# Patient Record
Sex: Male | Born: 1945 | Race: White | Hispanic: No | Marital: Married | Smoking: Current some day smoker
Health system: Southern US, Community
[De-identification: ages and names within clinical notes are randomized; demographics above are authoritative.]

## PROBLEM LIST (undated history)

## (undated) DIAGNOSIS — Z973 Presence of spectacles and contact lenses: Secondary | ICD-10-CM

## (undated) DIAGNOSIS — K08109 Complete loss of teeth, unspecified cause, unspecified class: Secondary | ICD-10-CM

## (undated) DIAGNOSIS — N529 Male erectile dysfunction, unspecified: Secondary | ICD-10-CM

## (undated) DIAGNOSIS — R3912 Poor urinary stream: Secondary | ICD-10-CM

## (undated) DIAGNOSIS — Z7901 Long term (current) use of anticoagulants: Secondary | ICD-10-CM

## (undated) DIAGNOSIS — I499 Cardiac arrhythmia, unspecified: Secondary | ICD-10-CM

## (undated) DIAGNOSIS — I10 Essential (primary) hypertension: Secondary | ICD-10-CM

## (undated) DIAGNOSIS — I482 Chronic atrial fibrillation, unspecified: Principal | ICD-10-CM

## (undated) DIAGNOSIS — Z972 Presence of dental prosthetic device (complete) (partial): Secondary | ICD-10-CM

## (undated) DIAGNOSIS — K429 Umbilical hernia without obstruction or gangrene: Secondary | ICD-10-CM

## (undated) DIAGNOSIS — N486 Induration penis plastica: Secondary | ICD-10-CM

## (undated) DIAGNOSIS — N138 Other obstructive and reflux uropathy: Secondary | ICD-10-CM

## (undated) DIAGNOSIS — N401 Enlarged prostate with lower urinary tract symptoms: Secondary | ICD-10-CM

## (undated) DIAGNOSIS — R310 Gross hematuria: Secondary | ICD-10-CM

## (undated) HISTORY — DX: Essential (primary) hypertension: I10

## (undated) HISTORY — PX: TONSILLECTOMY: SUR1361

## (undated) HISTORY — PX: TRANSTHORACIC ECHOCARDIOGRAM: SHX275

## (undated) HISTORY — DX: Umbilical hernia without obstruction or gangrene: K42.9

## (undated) HISTORY — DX: Male erectile dysfunction, unspecified: N52.9

## (undated) HISTORY — DX: Chronic atrial fibrillation, unspecified: I48.20

## (undated) HISTORY — PX: KNEE ARTHROSCOPY: SUR90

---
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
## 2013-11-19 DIAGNOSIS — I499 Cardiac arrhythmia, unspecified: Secondary | ICD-10-CM

## 2013-11-19 HISTORY — DX: Cardiac arrhythmia, unspecified: I49.9

## 2015-08-20 DIAGNOSIS — I482 Chronic atrial fibrillation, unspecified: Secondary | ICD-10-CM

## 2015-08-20 HISTORY — DX: Chronic atrial fibrillation, unspecified: I48.20

## 2015-09-12 ENCOUNTER — Encounter: Payer: Self-pay | Admitting: Cardiology

## 2015-09-14 DIAGNOSIS — K429 Umbilical hernia without obstruction or gangrene: Secondary | ICD-10-CM | POA: Insufficient documentation

## 2015-09-14 DIAGNOSIS — R002 Palpitations: Secondary | ICD-10-CM | POA: Insufficient documentation

## 2015-09-14 DIAGNOSIS — N529 Male erectile dysfunction, unspecified: Secondary | ICD-10-CM | POA: Insufficient documentation

## 2015-09-15 ENCOUNTER — Ambulatory Visit (INDEPENDENT_AMBULATORY_CARE_PROVIDER_SITE_OTHER): Payer: PPO | Admitting: Cardiology

## 2015-09-15 ENCOUNTER — Encounter: Payer: Self-pay | Admitting: Cardiology

## 2015-09-15 VITALS — BP 138/86 | HR 92 | Ht 68.5 in | Wt 175.0 lb

## 2015-09-15 DIAGNOSIS — I4891 Unspecified atrial fibrillation: Secondary | ICD-10-CM | POA: Diagnosis not present

## 2015-09-15 LAB — BASIC METABOLIC PANEL
BUN: 17 mg/dL (ref 7–25)
CHLORIDE: 104 mmol/L (ref 98–110)
CO2: 25 mmol/L (ref 20–31)
Calcium: 9.9 mg/dL (ref 8.6–10.3)
Creat: 0.93 mg/dL (ref 0.70–1.25)
Glucose, Bld: 79 mg/dL (ref 65–99)
POTASSIUM: 4.4 mmol/L (ref 3.5–5.3)
Sodium: 141 mmol/L (ref 135–146)

## 2015-09-15 LAB — TSH: TSH: 1.542 u[IU]/mL (ref 0.350–4.500)

## 2015-09-15 NOTE — Progress Notes (Signed)
Cardiology Office Note   Date:  09/16/2015   ID:  Scott Newton, Scott Newton, MRN [id number]  PCP:  London Pepper, MD    Chief Complaint  Patient presents with  . Atrial Fibrillation      History of Present Illness: Scott Newton is a 69 y.o. male who presents for evaluation of new onset atrial fibrillation.  [name] and palpitations and was found to have new onset atrial fibrillation by his PCP on 08/31/2015.  He was found to be in atrial fibrillation and was started on Eliquis. He has a history of palpitations but no [name] in the past.  His father died of heart disease at 28yo and he has never smoked.  He had an echo in 2011 showing normal LVF with mild LAE, mild MR and PR.  He is now here for further evaluation.  He denies any chest pain, SOB, DOE, LE edema, dizziness or syncope.      Past Medical History  Diagnosis Date  . Hypertension   . Palpitations   . ED (erectile dysfunction)   . Umbilical hernia   . Atrial fibrillation [facility])     Past Surgical History  Procedure Laterality Date  . Knee surgery Left      Current Outpatient Prescriptions  Medication Sig Dispense Refill  . apixaban (ELIQUIS) 5 MG TABS tablet Take 5 mg by mouth 2 (two) times daily.    . metoprolol succinate (TOPROL-XL) 50 MG 24 hr tablet Take 50 mg by mouth daily. Take with or immediately following a meal.     No current facility-administered medications for this visit.    Allergies:   Review of patient's allergies indicates no known allergies.    Social History:  The patient  reports that he has never smoked. He does not have any smokeless tobacco history on file. He reports that he drinks alcohol. He reports that he does not use illicit drugs.   Family History:  The patient's family history includes Heart disease (age of onset: 85) in his father; Hypertension (age of onset: 20) in his mother.    ROS:  Please see the history of present illness.    Otherwise, review of systems are positive for none.   All other systems are reviewed and negative.    PHYSICAL EXAM: VS:  BP 138/86 mmHg  Pulse 92  Ht 5' 8.5" (1.74 m)  Wt 175 lb (79.379 kg)  BMI 26.22 kg/m2 , BMI Body mass index is 26.22 kg/(m^2). GEN: Well nourished, well developed, in no acute distress HEENT: normal Neck: no JVD, carotid bruits, or masses Cardiac:iregularly irregular; no murmurs, rubs, or gallops,no edema  Respiratory:  clear to auscultation bilaterally, normal work of breathing GI: soft, nontender, nondistended, + BS MS: no deformity or atrophy Skin: warm and dry, no rash Neuro:  Strength and sensation are intact Psych: euthymic mood, full affect   EKG:  EKG is not ordered today.    Recent Labs: 09/15/2015: BUN 17; Creat 0.93; Potassium 4.4; Sodium 141; TSH 1.542    Lipid Panel No results found for: CHOL, TRIG, HDL, CHOLHDL, VLDL, LDLCALC, LDLDIRECT    Wt Readings from Last 3 Encounters:  09/15/15 175 lb (79.379 kg)      ASSESSMENT AND PLAN:  1.  New onset atrial fibrillation of unknown duration and well rate controlled.  Continue Eliquis and BB.  Check 2D  echo to assess LVF and LA size.  Check TSH and BMET. 2.  HTN - controlled/BB    Current medicines are reviewed at length with the patient today.  The patient does not have concerns regarding medicines.  The following changes have been made:  no change  Labs/ tests ordered today: See above Assessment and Plan  Orders Placed This Encounter  Procedures  . TSH  . Basic metabolic panel  . ECHOCARDIOGRAM COMPLETE     Disposition:   FU with me in 4 weeks  Signed, Sueanne Margarita, MD  09/16/2015 12:26 PM    [address], [place], [place]  [address] Phone: [phone]; Fax: [phone]

## 2015-09-15 NOTE — Patient Instructions (Signed)
Medication Instructions:  Your physician recommends that you continue on your current medications as directed. Please refer to the Current Medication list given to you today.   Labwork: TODAY: BMET, TSH  Testing/Procedures: Your physician has requested that you have an echocardiogram. Echocardiography is a painless test that uses sound waves to create images of your heart. It provides your doctor with information about the size and shape of your heart and how well your heart's chambers and valves are working. This procedure takes approximately one hour. There are no restrictions for this procedure.  Follow-Up: Your physician recommends that you schedule a follow-up appointment in: 4 weeks with a PA or NP.  Any Other Special Instructions Will Be Listed Below (If Applicable).     If you need a refill on your cardiac medications before your next appointment, please call your pharmacy.

## 2015-09-27 ENCOUNTER — Ambulatory Visit (HOSPITAL_COMMUNITY): Payer: PPO | Attending: Cardiovascular Disease

## 2015-09-27 ENCOUNTER — Other Ambulatory Visit: Payer: Self-pay

## 2015-09-27 DIAGNOSIS — I4891 Unspecified atrial fibrillation: Secondary | ICD-10-CM

## 2015-09-27 DIAGNOSIS — I351 Nonrheumatic aortic (valve) insufficiency: Secondary | ICD-10-CM | POA: Insufficient documentation

## 2015-09-27 DIAGNOSIS — I10 Essential (primary) hypertension: Secondary | ICD-10-CM | POA: Insufficient documentation

## 2015-09-27 DIAGNOSIS — I340 Nonrheumatic mitral (valve) insufficiency: Secondary | ICD-10-CM | POA: Diagnosis not present

## 2015-09-27 DIAGNOSIS — I517 Cardiomegaly: Secondary | ICD-10-CM | POA: Diagnosis not present

## 2015-09-29 ENCOUNTER — Telehealth: Payer: Self-pay | Admitting: Cardiology

## 2015-09-29 NOTE — Telephone Encounter (Signed)
Follow up  ° ° °Patient returning call back to nurse  °

## 2015-09-29 NOTE — Telephone Encounter (Signed)
Informed patient of results and verbal understanding expressed.  

## 2015-09-29 NOTE — Telephone Encounter (Signed)
-----   Message from Sueanne Margarita, MD sent at 09/29/2015  8:36 AM EST ----- Normal LVF with trivial AR and mild MR, severely dilated LA

## 2015-09-30 DIAGNOSIS — I4891 Unspecified atrial fibrillation: Secondary | ICD-10-CM | POA: Diagnosis not present

## 2015-10-09 ENCOUNTER — Encounter: Payer: Self-pay | Admitting: Physician Assistant

## 2015-10-09 DIAGNOSIS — I482 Chronic atrial fibrillation, unspecified: Secondary | ICD-10-CM | POA: Insufficient documentation

## 2015-10-09 DIAGNOSIS — I10 Essential (primary) hypertension: Secondary | ICD-10-CM | POA: Insufficient documentation

## 2015-10-09 NOTE — Progress Notes (Addendum)
Cardiology Office Note Date:  10/10/2015  Patient ID:  Scott Newton, Scott Newton Mar 23, 1946, MRN [id number] PCP:  Scott Pepper, MD  Cardiologist: Radford Pax   Chief Complaint: f/u afib  History of Present Illness: Scott Newton is a 69 y.o. male with history of HTN, [name], ED who presents for f/u. He has a h/o palpitations and was found to be in new-onset AF by PCP on 08/31/15. He was started on Eliquis and referred to cardiology. Dr. Radford Pax ordered 2D echo 09/27/15: mild focal basal hypertrophy of septum, EF 60-65%, trivial AI, mild MR, severely dilated LA. TSH and BMET on 09/15/15 were normal. He was able to pull up CBC from his PCP's OV earlier in 08/2015 which showed mildly increased Hgb/Hct but no anemia. CHADSVASC 2.  He comes in today for follow-up. He is completely asymptomatic from a cardiovascular standpoint - no CP, SOB, palpitations or awareness of his AF. (He says he used to feel his afib when it first started but no longer is able to tell he's in it.) He has not had any functional limitation. He exercises occasionally without any angina. He drinks 2-3 glasses of wine several nights a week. He drinks 2 cups of coffee daily. He smokes occasional cigar. He thinks he snores. He says that at last visit he had mentioned missing a day of Eliquis therefore a procedure was deferred - sounds like he is referring to DCCV but I do not see mention of this in the notes. He reports faithful compliance with medications since that visit and has not missed any doses. BP running higher. He does not follow this at home but was surprised as it does not typically run this high. He took his meds already about 4 hours ago. He says at the dentist last week it was checked by wrist cuff and was [id number] diastolic number but running higher).   Past Medical History  Diagnosis Date  . Essential hypertension   . ED (erectile dysfunction)   . Umbilical hernia   . Persistent atrial fibrillation ([name])     a. Dx 08/2015.     Past Surgical History  Procedure Laterality Date  . Knee surgery Left     Current Outpatient Prescriptions  Medication Sig Dispense Refill  . apixaban (ELIQUIS) 5 MG TABS tablet Take 5 mg by mouth 2 (two) times daily.    . metoprolol succinate (TOPROL-XL) 50 MG 24 hr tablet Take 50 mg by mouth daily. Take with or immediately following a meal.     No current facility-administered medications for this visit.    Allergies:   Review of patient's allergies indicates no known allergies.   Social History:  The patient  reports that he has quit smoking. He does not have any smokeless tobacco history on file. He reports that he drinks alcohol. He reports that he does not use illicit drugs.   Family History:  The patient's family history includes Heart attack in his father; Heart disease (age of onset: 39) in his father; Hypertension (age of onset: 31) in his mother. There is no history of Stroke.  ROS:  Please see the history of present illness.  All other systems are reviewed and otherwise negative.   PHYSICAL EXAM:  VS:  BP 160/90 mmHg  Pulse 88  Ht 5' 8.5" (1.74 m)  Wt 176 lb (79.833 kg)  BMI 26.37 kg/m2 BMI: Body mass index is 26.37 kg/(m^2). Recheck by me - 162/88 Well nourished, well developed WM in no acute distress  HEENT: normocephalic, atraumatic Neck: no JVD, carotid bruits or masses Cardiac:  Irregularly irregular, rate controlled, no murmurs, rubs, or gallops Lungs:  clear to auscultation bilaterally, no wheezing, rhonchi or rales Abd: soft, nontender, no hepatomegaly, + BS MS: no deformity or atrophy Ext: no edema Skin: warm and dry, no rash Neuro:  moves all extremities spontaneously, no focal abnormalities noted, follows commands Psych: euthymic mood, full affect   EKG:  Done today shows atrial fib 88bpm, occasional PVC  Recent Labs: 09/15/2015: BUN 17; Creat 0.93; Potassium 4.4; Sodium 141; TSH 1.542  No results found for requested labs within last 365 days.     CrCl cannot be calculated (Patient has no serum creatinine result on file.).   Wt Readings from Last 3 Encounters:  10/10/15 176 lb (79.833 kg)  09/15/15 175 lb (79.379 kg)     Other studies reviewed: Additional studies/records reviewed today include: summarized above  ASSESSMENT AND PLAN:  1. Persistent atrial fib - he remains asymptomatic at this time. I offered him the option of proceeding with attempt at [place]. He is not convinced about the need, since he is tolerating AF without any problems. I will plan to review with Dr. Radford Pax. Continue Eliquis. Increase BB as below. The patient will also consider recommendation for sleep study given reported snoring, although he does not really carry the body habitus for this. Addendum: see phone note. Dr. Radford Pax confirms plan was for DCCV after 4 weeks of uninterrupted anticoagulation. Will schedule. If he reverts to AF after DCCV, would likely plan to just continue rate control. 2. Essential HTN - BP running higher in clinic today. Will increase in Toprol to 75mg  daily. Advised he follow BP at home over the next week with med change and call if running >130/90. 3. Habitual alcohol/caffeine/cigar use - we discussed recommendations to cut down on alcohol and caffeine as they could be contributing to his AF. Also advised to d/c cigar use.  Disposition: F/u with Dr. Radford Pax in 6 months - sooner if he decides to proceed with DCCV.  Current medicines are reviewed at length with the patient today.  The patient did not have any concerns regarding medicines.  Scott Ache PA-C 10/10/2015 10:25 AM     [address] [place] [address] [phone] (office)  [phone] (fax)

## 2015-10-10 ENCOUNTER — Telehealth: Payer: Self-pay | Admitting: Physician Assistant

## 2015-10-10 ENCOUNTER — Ambulatory Visit (INDEPENDENT_AMBULATORY_CARE_PROVIDER_SITE_OTHER): Payer: PPO | Admitting: Physician Assistant

## 2015-10-10 ENCOUNTER — Encounter: Payer: Self-pay | Admitting: Physician Assistant

## 2015-10-10 VITALS — BP 160/90 | HR 88 | Ht 68.5 in | Wt 176.0 lb

## 2015-10-10 DIAGNOSIS — F1090 Alcohol use, unspecified, uncomplicated: Secondary | ICD-10-CM

## 2015-10-10 DIAGNOSIS — I10 Essential (primary) hypertension: Secondary | ICD-10-CM

## 2015-10-10 DIAGNOSIS — F1010 Alcohol abuse, uncomplicated: Secondary | ICD-10-CM | POA: Diagnosis not present

## 2015-10-10 DIAGNOSIS — I4819 Other persistent atrial fibrillation: Secondary | ICD-10-CM

## 2015-10-10 DIAGNOSIS — I481 Persistent atrial fibrillation: Secondary | ICD-10-CM | POA: Diagnosis not present

## 2015-10-10 DIAGNOSIS — R002 Palpitations: Secondary | ICD-10-CM | POA: Diagnosis not present

## 2015-10-10 DIAGNOSIS — Z7289 Other problems related to lifestyle: Secondary | ICD-10-CM

## 2015-10-10 MED ORDER — METOPROLOL SUCCINATE ER 50 MG PO TB24: ORAL_TABLET | ORAL | Status: DC

## 2015-10-10 NOTE — Telephone Encounter (Signed)
Error

## 2015-10-10 NOTE — Patient Instructions (Addendum)
Medication Instructions:  Your physician has recommended you make the following change in your medication:  1.  INCREASE the Toprol to 50 mg taking 1 1/2 tablet daily   Labwork: None ordered  Testing/Procedures: None ordered  Follow-Up: Your physician recommends that you schedule a follow-up appointment in: [place].      Any Other Special Instructions Will Be Listed Below (If Applicable).  Follow your blood pressure.  Call our office if it is running higher than 130/90. Please consider decreasing the caffeine / alcohol intake long term.  If you need a refill on your cardiac medications before your next appointment, please call your pharmacy.

## 2015-10-10 NOTE — Telephone Encounter (Signed)
Please call patient and tell him that I reviewed his office visit today with Dr. Radford Pax who still agrees that we should try cardioversion at least once to restore normal sinus rhythm. This needs to be done after he has taken 4 weeks of Eliquis uninterrupted. His last OV was 10/27 at which time he had reported missing a dose of Eliquis around this time. Please double check when he started taking Eliquis without missing a single dose - can schedule DCCV for 4 weeks after this date. (For example, if he knows for sure that from 10/27 onward that he did not miss any doses, the earliest we can schedule is 11/24.) Thanks! Will need f/u visit 2 weeks post-DCCV with APP.  Dayna Dunn PA-C

## 2015-10-11 ENCOUNTER — Other Ambulatory Visit: Payer: Self-pay | Admitting: Physician Assistant

## 2015-10-11 ENCOUNTER — Telehealth: Payer: Self-pay | Admitting: *Deleted

## 2015-10-11 NOTE — Telephone Encounter (Signed)
Been talking back and forth with the pt re: Cardioversion.  Pt called back and we finally got pt to agree to this procedure.  Pt has been scheduled for Cardioversion 10-19-15 @ 12:00, arriving at 10:00 a.m @ [facility] with Dr. Johnsie Cancel.  Pt has been advised that he is to be NPO after midnight the night before, only drinking enough water with his medications on the morning of his procedure.  Pt has been aware that he will need to come into the office 10/17/15 for lab work before the procedure.   I will get check out to find me a f/u appt for 2 weeks with 1st available app and call the pt back.  Pt advised that he will be leaving town 11/05/15 and not returning until 11/14/15.  I advised pt that I would find him something and call him back.  Pt verbalized understanding.

## 2015-10-11 NOTE — Addendum Note (Signed)
Addended by: Gaetano Net on: 10/11/2015 09:04 AM   Modules accepted: Orders

## 2015-10-17 ENCOUNTER — Other Ambulatory Visit: Payer: PPO | Admitting: *Deleted

## 2015-10-17 DIAGNOSIS — I10 Essential (primary) hypertension: Secondary | ICD-10-CM

## 2015-10-17 DIAGNOSIS — I4819 Other persistent atrial fibrillation: Secondary | ICD-10-CM

## 2015-10-17 LAB — CBC
HEMATOCRIT: 46.7 % (ref 39.0–52.0)
HEMOGLOBIN: 16.1 g/dL (ref 13.0–17.0)
MCH: 31.2 pg (ref 26.0–34.0)
MCHC: 34.5 g/dL (ref 30.0–36.0)
MCV: 90.5 fL (ref 78.0–100.0)
MPV: 10 fL (ref 8.6–12.4)
Platelets: 247 10*3/uL (ref 150–400)
RBC: 5.16 MIL/uL (ref 4.22–5.81)
RDW: 13.1 % (ref 11.5–15.5)
WBC: 6.1 10*3/uL (ref 4.0–10.5)

## 2015-10-17 LAB — BASIC METABOLIC PANEL
BUN: 16 mg/dL (ref 7–25)
CHLORIDE: 102 mmol/L (ref 98–110)
CO2: 26 mmol/L (ref 20–31)
CREATININE: 0.95 mg/dL (ref 0.70–1.25)
Calcium: 9.2 mg/dL (ref 8.6–10.3)
Glucose, Bld: 79 mg/dL (ref 65–99)
POTASSIUM: 4.1 mmol/L (ref 3.5–5.3)
Sodium: 139 mmol/L (ref 135–146)

## 2015-10-19 ENCOUNTER — Ambulatory Visit (HOSPITAL_COMMUNITY)
Admission: RE | Admit: 2015-10-19 | Discharge: 2015-10-19 | Disposition: A | Discharge status: Home / Self Care | Payer: PPO | Source: Ambulatory Visit | Attending: Cardiovascular Disease | Admitting: Cardiovascular Disease

## 2015-10-19 ENCOUNTER — Ambulatory Visit (HOSPITAL_COMMUNITY): Payer: PPO | Admitting: Certified Registered Nurse Anesthetist

## 2015-10-19 ENCOUNTER — Encounter (HOSPITAL_COMMUNITY): Payer: Self-pay

## 2015-10-19 ENCOUNTER — Encounter (HOSPITAL_COMMUNITY)
Admission: RE | Disposition: A | Discharge status: Home / Self Care | Payer: Self-pay | Source: Ambulatory Visit | Attending: Cardiovascular Disease

## 2015-10-19 DIAGNOSIS — Z87891 Personal history of nicotine dependence: Secondary | ICD-10-CM | POA: Diagnosis not present

## 2015-10-19 DIAGNOSIS — Z7901 Long term (current) use of anticoagulants: Secondary | ICD-10-CM | POA: Diagnosis not present

## 2015-10-19 DIAGNOSIS — I481 Persistent atrial fibrillation: Secondary | ICD-10-CM | POA: Diagnosis present

## 2015-10-19 DIAGNOSIS — I4891 Unspecified atrial fibrillation: Secondary | ICD-10-CM | POA: Diagnosis not present

## 2015-10-19 DIAGNOSIS — I10 Essential (primary) hypertension: Secondary | ICD-10-CM | POA: Diagnosis not present

## 2015-10-19 HISTORY — PX: CARDIOVERSION: SHX1299

## 2015-10-19 SURGERY — CARDIOVERSION
Anesthesia: Monitor Anesthesia Care

## 2015-10-19 MED ORDER — METOPROLOL TARTRATE 1 MG/ML IV SOLN
INTRAVENOUS | Status: AC
  Filled 2015-10-19: qty 5

## 2015-10-19 MED ORDER — PROPOFOL 10 MG/ML IV BOLUS
INTRAVENOUS | Status: DC | PRN
  Administered 2015-10-19: 120 mg via INTRAVENOUS

## 2015-10-19 MED ORDER — SODIUM CHLORIDE 0.9 % IJ SOLN: 3.0000 mL | Freq: Two times a day (BID) | INTRAMUSCULAR | Status: DC

## 2015-10-19 MED ORDER — METOPROLOL TARTRATE 1 MG/ML IV SOLN
INTRAVENOUS | Status: DC | PRN
  Administered 2015-10-19: 3 mg via INTRAVENOUS
  Administered 2015-10-19: 2 mg via INTRAVENOUS

## 2015-10-19 MED ORDER — LIDOCAINE HCL (CARDIAC) 20 MG/ML IV SOLN
INTRAVENOUS | Status: DC | PRN
  Administered 2015-10-19: 40 mg via INTRAVENOUS

## 2015-10-19 MED ORDER — SODIUM CHLORIDE 0.9 % IJ SOLN: 3.0000 mL | INTRAMUSCULAR | Status: DC | PRN

## 2015-10-19 MED ORDER — SODIUM CHLORIDE 0.9 % IV SOLN
250.0000 mL | INTRAVENOUS | Status: DC
  Administered 2015-10-19: 250 mL via INTRAVENOUS

## 2015-10-19 NOTE — Discharge Instructions (Signed)
Monitored Anesthesia Care °Monitored anesthesia care is an anesthesia service for a medical procedure. Anesthesia is the loss of the ability to feel pain. It is produced by medicines called anesthetics. It may affect a small area of your body (local anesthesia), a large area of your body (regional anesthesia), or your entire body (general anesthesia). The need for monitored anesthesia care depends your procedure, your condition, and the potential need for regional or general anesthesia. It is often provided during procedures where:  °· General anesthesia may be needed if there are complications. This is because you need special care when you are under general anesthesia.   °· You will be under local or regional anesthesia. This is so that you are able to have higher levels of anesthesia if needed.   °· You will receive calming medicines (sedatives). This is especially the case if sedatives are given to put you in a semi-conscious state of relaxation (deep sedation). This is because the amount of sedative needed to produce this state can be hard to predict. Too much of a sedative can produce general anesthesia. °Monitored anesthesia care is performed by one or more health care providers who have special training in all types of anesthesia. You will need to meet with these health care providers before your procedure. During this meeting, they will ask you about your medical history. They will also give you instructions to follow. (For example, you will need to stop eating and drinking before your procedure. You may also need to stop or change medicines you are taking.) During your procedure, your health care providers will stay with you. They will:  °· Watch your condition. This includes watching your blood pressure, breathing, and level of pain.   °· Diagnose and treat problems that occur.   °· Give medicines if they are needed. These may include calming medicines (sedatives) and anesthetics.   °· Make sure you are  comfortable.   °Having monitored anesthesia care does not necessarily mean that you will be under anesthesia. It does mean that your health care providers will be able to manage anesthesia if you need it or if it occurs. It also means that you will be able to have a different type of anesthesia than you are having if you need it. When your procedure is complete, your health care providers will continue to watch your condition. They will make sure any medicines wear off before you are allowed to go home.  °  °This information is not intended to replace advice given to you by your health care provider. Make sure you discuss any questions you have with your health care provider. °  °Document Released: 08/01/2005 Document Revised: 11/26/2014 Document Reviewed: 12/17/2012 °Elsevier Interactive Patient Education ©2016 [employer] °Electrical Cardioversion, Care After °Refer to this sheet in the next few weeks. These instructions provide you with information on caring for yourself after your procedure. Your health care provider may also give you more specific instructions. Your treatment has been planned according to current medical practices, but problems sometimes occur. Call your health care provider if you have any problems or questions after your procedure. °WHAT TO EXPECT AFTER THE PROCEDURE °After your procedure, it is typical to have the following sensations: °· Some redness on the skin where the shocks were delivered. If this is tender, a sunburn lotion or hydrocortisone cream may help. °· Possible return of an abnormal heart rhythm within hours or days after the procedure. °HOME CARE INSTRUCTIONS °· Take medicines only as directed by your health care provider.   Be sure you understand how and when to take your medicine. °· Learn how to feel your pulse and check it often. °· Limit your activity for 48 hours after the procedure or as directed by your health care provider. °· Avoid or minimize caffeine and other  stimulants as directed by your health care provider. °SEEK MEDICAL CARE IF: °· You feel like your heart is beating too fast or your pulse is not regular. °· You have any questions about your medicines. °· You have bleeding that will not stop. °SEEK IMMEDIATE MEDICAL CARE IF: °· You are dizzy or feel faint. °· It is hard to breathe or you feel short of breath. °· There is a change in discomfort in your chest. °· Your speech is slurred or you have trouble moving an arm or leg on one side of your body. °· You get a serious muscle cramp that does not go away. °· Your fingers or toes turn cold or blue. °  °This information is not intended to replace advice given to you by your health care provider. Make sure you discuss any questions you have with your health care provider. °  °Document Released: 08/26/2013 Document Revised: 11/26/2014 Document Reviewed: 08/26/2013 °Elsevier Interactive Patient Education ©2016 [employer] ° °

## 2015-10-19 NOTE — Anesthesia Procedure Notes (Signed)
Procedure Name: MAC Date/Time: 10/19/2015 10:57 AM Performed by: Layla Maw Pre-anesthesia Checklist: Patient identified, Emergency Drugs available, Suction available, Patient being monitored and Timeout performed Patient Re-evaluated:Patient Re-evaluated prior to inductionOxygen Delivery Method: Ambu bag and Nasal cannula Preoxygenation: Pre-oxygenation with 100% oxygen Intubation Type: IV induction Number of attempts: 1

## 2015-10-19 NOTE — H&P (View-Only) (Signed)
Cardiology Office Note Date:  10/10/2015  Patient ID:  Scott Newton, Scott Newton 08/22/1946, MRN [id number] PCP:  London Pepper, MD  Cardiologist: Radford Pax   Chief Complaint: f/u afib  History of Present Illness: Scott Conception. Newton is a 69 y.o. male with history of HTN, [name], ED who presents for f/u. He has a h/o palpitations and was found to be in new-onset AF by PCP on 08/31/15. He was started on Eliquis and referred to cardiology. Dr. Radford Pax ordered 2D echo 09/27/15: mild focal basal hypertrophy of septum, EF 60-65%, trivial AI, mild MR, severely dilated LA. TSH and BMET on 09/15/15 were normal. He was able to pull up CBC from his PCP's OV earlier in 08/2015 which showed mildly increased Hgb/Hct but no anemia. CHADSVASC 2.  He comes in today for follow-up. He is completely asymptomatic from a cardiovascular standpoint - no CP, SOB, palpitations or awareness of his AF. (He says he used to feel his afib when it first started but no longer is able to tell he's in it.) He has not had any functional limitation. He exercises occasionally without any angina. He drinks 2-3 glasses of wine several nights a week. He drinks 2 cups of coffee daily. He smokes occasional cigar. He thinks he snores. He says that at last visit he had mentioned missing a day of Eliquis therefore a procedure was deferred - sounds like he is referring to DCCV but I do not see mention of this in the notes. He reports faithful compliance with medications since that visit and has not missed any doses. BP running higher. He does not follow this at home but was surprised as it does not typically run this high. He took his meds already about 4 hours ago. He says at the dentist last week it was checked by wrist cuff and was [id number] diastolic number but running higher).   Past Medical History  Diagnosis Date  . Essential hypertension   . ED (erectile dysfunction)   . Umbilical hernia   . Persistent atrial fibrillation ([place])     a. Dx 08/2015.     Past Surgical History  Procedure Laterality Date  . Knee surgery Left     Current Outpatient Prescriptions  Medication Sig Dispense Refill  . apixaban (ELIQUIS) 5 MG TABS tablet Take 5 mg by mouth 2 (two) times daily.    . metoprolol succinate (TOPROL-XL) 50 MG 24 hr tablet Take 50 mg by mouth daily. Take with or immediately following a meal.     No current facility-administered medications for this visit.    Allergies:   Review of patient's allergies indicates no known allergies.   Social History:  The patient  reports that he has quit smoking. He does not have any smokeless tobacco history on file. He reports that he drinks alcohol. He reports that he does not use illicit drugs.   Family History:  The patient's family history includes Heart attack in his father; Heart disease (age of onset: 69) in his father; Hypertension (age of onset: 56) in his mother. There is no history of Stroke.  ROS:  Please see the history of present illness.  All other systems are reviewed and otherwise negative.   PHYSICAL EXAM:  VS:  BP 160/90 mmHg  Pulse 88  Ht 5' 8.5" (1.74 m)  Wt 176 lb (79.833 kg)  BMI 26.37 kg/m2 BMI: Body mass index is 26.37 kg/(m^2). Recheck by me - 162/88 Well nourished, well developed WM in no acute distress  HEENT: normocephalic, atraumatic Neck: no JVD, carotid bruits or masses Cardiac:  Irregularly irregular, rate controlled, no murmurs, rubs, or gallops Lungs:  clear to auscultation bilaterally, no wheezing, rhonchi or rales Abd: soft, nontender, no hepatomegaly, + BS MS: no deformity or atrophy Ext: no edema Skin: warm and dry, no rash Neuro:  moves all extremities spontaneously, no focal abnormalities noted, follows commands Psych: euthymic mood, full affect   EKG:  Done today shows atrial fib 88bpm, occasional PVC  Recent Labs: 09/15/2015: BUN 17; Creat 0.93; Potassium 4.4; Sodium 141; TSH 1.542  No results found for requested labs within last 365 days.     CrCl cannot be calculated (Patient has no serum creatinine result on file.).   Wt Readings from Last 3 Encounters:  10/10/15 176 lb (79.833 kg)  09/15/15 175 lb (79.379 kg)     Other studies reviewed: Additional studies/records reviewed today include: summarized above  ASSESSMENT AND PLAN:  1. Persistent atrial fib - he remains asymptomatic at this time. I offered him the option of proceeding with attempt at [place]. He is not convinced about the need, since he is tolerating AF without any problems. I will plan to review with Dr. Radford Pax. Continue Eliquis. Increase BB as below. The patient will also consider recommendation for sleep study given reported snoring, although he does not really carry the body habitus for this. Addendum: see phone note. Dr. Radford Pax confirms plan was for DCCV after 4 weeks of uninterrupted anticoagulation. Will schedule. If he reverts to AF after DCCV, would likely plan to just continue rate control. 2. Essential HTN - BP running higher in clinic today. Will increase in Toprol to 75mg  daily. Advised he follow BP at home over the next week with med change and call if running >130/90. 3. Habitual alcohol/caffeine/cigar use - we discussed recommendations to cut down on alcohol and caffeine as they could be contributing to his AF. Also advised to d/c cigar use.  Disposition: F/u with Dr. Radford Pax in 6 months - sooner if he decides to proceed with DCCV.  Current medicines are reviewed at length with the patient today.  The patient did not have any concerns regarding medicines.  Raechel Ache PA-C 10/10/2015 10:25 AM     [address] [place] [address] [phone] (office)  [phone] (fax)

## 2015-10-19 NOTE — Interval H&P Note (Signed)
History and Physical Interval Note:  10/19/2015 9:11 AM  Marcello Moores C. Armand  has presented today for surgery, with the diagnosis of PERSISTENT AFIB  The various methods of treatment have been discussed with the patient and family. After consideration of risks, benefits and other options for treatment, the patient has consented to  Procedure(s): CARDIOVERSION (N/A) as a surgical intervention .  The patient's history has been reviewed, patient examined, no change in status, stable for surgery.  I have reviewed the patient's chart and labs.  Questions were answered to the patient's satisfaction.     Jenkins Rouge

## 2015-10-19 NOTE — Transfer of Care (Signed)
Immediate Anesthesia Transfer of Care Note  Patient: Scott Newton. Bias  Procedure(s) Performed: Procedure(s): CARDIOVERSION (N/A)  Patient Location: PACU  Anesthesia Type:MAC  Level of Consciousness: awake, alert , oriented and patient cooperative  Airway & Oxygen Therapy: Patient Spontanous Breathing and Patient connected to nasal cannula oxygen  Post-op Assessment: Report given to RN, Post -op Vital signs reviewed and stable, Patient moving all extremities and Patient moving all extremities X 4  Post vital signs: Reviewed and stable  Last Vitals:  Filed Vitals:   10/19/15 1117 10/19/15 1118  BP: 161/102   Pulse:    Temp:    Resp: 15 11    Complications: No apparent anesthesia complications

## 2015-10-19 NOTE — Anesthesia Preprocedure Evaluation (Signed)
Anesthesia Evaluation  Patient identified by MRN, date of birth, ID band Patient awake    History of Anesthesia Complications Negative for: history of anesthetic complications  Airway Mallampati: I  TM Distance: >3 FB Neck ROM: Full    Dental  (+) Partial Upper, Partial Lower   Pulmonary neg pulmonary ROS, former smoker,    breath sounds clear to auscultation       Cardiovascular Exercise Tolerance: Good hypertension, Pt. on medications and Pt. on home beta blockers + dysrhythmias Atrial Fibrillation  Rhythm:Irregular Rate:Abnormal     Neuro/Psych negative neurological ROS  negative psych ROS   GI/Hepatic negative GI ROS, Neg liver ROS,   Endo/Other  negative endocrine ROS  Renal/GU negative Renal ROS     Musculoskeletal negative musculoskeletal ROS (+)   Abdominal (+)  Abdomen: soft. Bowel sounds: normal.  Peds  Hematology negative hematology ROS (+)   Anesthesia Other Findings   Reproductive/Obstetrics                             BP Readings from Last 3 Encounters:  10/19/15 161/111  10/10/15 160/90  09/15/15 138/86   Lab Results  Component Value Date   WBC 6.1 10/17/2015   HGB 16.1 10/17/2015   HCT 46.7 10/17/2015   MCV 90.5 10/17/2015   PLT 247 10/17/2015     Chemistry      Component Value Date/Time   NA 139 10/17/2015 1248   K 4.1 10/17/2015 1248   CL 102 10/17/2015 1248   CO2 26 10/17/2015 1248   BUN 16 10/17/2015 1248   CREATININE 0.95 10/17/2015 1248      Component Value Date/Time   CALCIUM 9.2 10/17/2015 1248       Anesthesia Physical Anesthesia Plan  ASA: II  Anesthesia Plan: MAC   Post-op Pain Management:    Induction: Intravenous  Airway Management Planned: Nasal Cannula  Additional Equipment:   Intra-op Plan:   Post-operative Plan:   Informed Consent: I have reviewed the patients History and Physical, chart, labs and discussed the  procedure including the risks, benefits and alternatives for the proposed anesthesia with the patient or authorized representative who has indicated his/her understanding and acceptance.   Dental advisory given  Plan Discussed with: CRNA and Anesthesiologist  Anesthesia Plan Comments:         Anesthesia Quick Evaluation

## 2015-10-19 NOTE — CV Procedure (Signed)
DCC: Anesthesia propofol/lidocaine Dr Oletta Lamas  Afib rate 120 Lynch x 1 150 J biphasic converted to NSR rae 65 PaC;s  On Rx Eliquis with no missed doses  No immediate neurologic sequleae  Jenkins Rouge

## 2015-10-20 ENCOUNTER — Encounter (HOSPITAL_COMMUNITY): Payer: Self-pay | Admitting: Cardiovascular Disease

## 2015-10-21 NOTE — Anesthesia Postprocedure Evaluation (Signed)
Anesthesia Post Note  Patient: Scott Newton. [place]  Procedure(s) Performed: Procedure(s) (LRB): CARDIOVERSION (N/A)  Patient location during evaluation: PACU Anesthesia Type: General Level of consciousness: awake Pain management: pain level controlled Vital Signs Assessment: post-procedure vital signs reviewed and stable Respiratory status: spontaneous breathing Anesthetic complications: no    Last Vitals:  Filed Vitals:   10/19/15 1150 10/19/15 1156  BP: 131/112 153/82  Pulse: 65 61  Temp:    Resp: 26 13    Last Pain: There were no vitals filed for this visit.               EDWARDS,Rainy Rothman

## 2015-10-26 ENCOUNTER — Telehealth: Payer: Self-pay | Admitting: Cardiology

## 2015-10-26 NOTE — Telephone Encounter (Signed)
Pt was put on samaples of Eliquis and ran out yesterday, appt not until 11-02-15 what to do? pls advise

## 2015-10-26 NOTE — Telephone Encounter (Signed)
Informed patient samples are being placed at front desk.

## 2015-10-31 ENCOUNTER — Encounter: Payer: Self-pay | Admitting: *Deleted

## 2015-11-02 ENCOUNTER — Ambulatory Visit (INDEPENDENT_AMBULATORY_CARE_PROVIDER_SITE_OTHER): Payer: PPO | Admitting: Cardiology

## 2015-11-02 ENCOUNTER — Encounter: Payer: Self-pay | Admitting: Cardiology

## 2015-11-02 VITALS — BP 124/80 | HR 80 | Ht 68.5 in | Wt 172.1 lb

## 2015-11-02 DIAGNOSIS — I4819 Other persistent atrial fibrillation: Secondary | ICD-10-CM

## 2015-11-02 DIAGNOSIS — I10 Essential (primary) hypertension: Secondary | ICD-10-CM | POA: Diagnosis not present

## 2015-11-02 DIAGNOSIS — R002 Palpitations: Secondary | ICD-10-CM | POA: Diagnosis not present

## 2015-11-02 DIAGNOSIS — I481 Persistent atrial fibrillation: Secondary | ICD-10-CM

## 2015-11-02 NOTE — Patient Instructions (Signed)
Medication Instructions:  Your physician recommends that you continue on your current medications as directed. Please refer to the Current Medication list given to you today.   Labwork: None ordered  Testing/Procedures: None ordered  Follow-Up:. Your physician recommends that you schedule a follow-up appointment in: [place].   Your physician recommends that you schedule a follow-up appointment with Dr. Radford Pax as planned.  You will receive a reminder letter in the mail two months in advance. If you don't receive a letter, please call our office to schedule the follow-up appointment.   Any Other Special Instructions Will Be Listed Below (If Applicable).  If you need a refill on your cardiac medications before your next appointment, please call your pharmacy.

## 2015-11-02 NOTE — Progress Notes (Signed)
Cardiology Office Note   Date:  11/02/2015   ID:  Scott Newton, Jochim [birth year], MRN [id number]  PCP:  London Pepper, MD  Cardiologist:  Dr. Radford Pax    Chief Complaint  Patient presents with  . Atrial Fibrillation    no SOB      History of Present Illness: Scott Newton is a 69 y.o. male who presents for post cardioversion.    [name], [name], [name]  He has a h/o palpitations and was found to be in new-onset AF by PCP on 08/31/15. He was started on Eliquis and referred to cardiology. Dr. Radford Pax ordered 2D echo 09/27/15: mild focal basal hypertrophy of septum, EF 60-65%, trivial AI, mild MR, severely dilated LA. TSH and BMET on 09/15/15 were normal. He was able to pull up CBC from his PCP's OV earlier in 08/2015 which showed mildly increased Hgb/Hct but no anemia. CHADSVASC 2.  Last seen in the office 10/09/15 with plans for DCCV for his persistent a fib.   Today post successful DCCV he is unfortunately back in a fib rate in the 80s;  Pt could not tell he was in a fib.  No chest pain and no SOB.  He played golf without problems. Pt has felt well since DCCV, actually better.  His BP has imrproved.   Past Medical History  Diagnosis Date  . Essential hypertension   . ED (erectile dysfunction)   . Umbilical hernia   . Persistent atrial fibrillation ([place])     a. Dx 08/2015.    Past Surgical History  Procedure Laterality Date  . Knee surgery Left   . Cardioversion N/A 10/19/2015    Procedure: CARDIOVERSION;  Surgeon: Josue Hector, MD;  Location: [facility] ENDOSCOPY;  Service: Cardiovascular;  Laterality: N/A;     Current Outpatient Prescriptions  Medication Sig Dispense Refill  . apixaban (ELIQUIS) 5 MG TABS tablet Take 5 mg by mouth 2 (two) times daily.    . metoprolol succinate (TOPROL-XL) 50 MG 24 hr tablet Take 1 1/2 tablet daily with or immediately following a meal. 135 tablet 3  . Multiple Vitamins-Minerals (MULTIVITAMIN WITH  MINERALS) tablet Take 1 tablet by mouth daily.     No current facility-administered medications for this visit.    Allergies:   Review of patient's allergies indicates no known allergies.    Social History:  The patient  reports that he has been smoking.  He has never used smokeless tobacco. He reports that he drinks alcohol. He reports that he does not use illicit drugs.   Family History:  The patient's family history includes Heart attack (age of onset: 27) in his father; Heart attack (age of onset: 7) in his brother; Heart disease (age of onset: 52) in his father; Hypertension (age of onset: 63) in his mother. There is no history of Stroke.    ROS:  General:no colds or fevers, no weight changes Skin:no rashes or ulcers HEENT:no blurred vision, no congestion CV:see HPI PUL:see HPI GI:no diarrhea constipation or melena, no indigestion GU:no hematuria, no dysuria MS:no joint pain, no claudication Neuro:no syncope, no lightheadedness Endo:no diabetes, no thyroid disease  Wt Readings from Last 3 Encounters:  11/02/15 172 lb 1.9 oz (78.073 kg)  10/10/15 176 lb (79.833 kg)  09/15/15 175 lb (79.379 kg)     PHYSICAL EXAM: VS:  BP 124/80 mmHg  Pulse 80  Ht 5' 8.5" (1.74 m)  Wt 172 lb 1.9  oz (78.073 kg)  BMI 25.79 kg/m2 , BMI Body mass index is 25.79 kg/(m^2). General:Pleasant affect, NAD Skin:Warm and dry, brisk capillary refill HEENT:normocephalic, sclera clear, mucus membranes moist Neck:supple, no JVD, no bruits  Heart:irreg irreg, without murmur, gallup, rub or click Lungs:clear without rales, rhonchi, or wheezes [id number], non tender, + BS, do not palpate liver spleen or masses Ext:no lower ext edema, 2+ pedal pulses, 2+ radial pulses Neuro:alert and oriented X 3, MAE, follows commands, + facial symmetry    EKG:  EKG is ordered today. The ekg ordered today demonstrates  A fib rate of 80 with PVC. No acute changes.     Recent Labs: 09/15/2015: TSH 1.542 10/17/2015:  BUN 16; Creat 0.95; Hemoglobin 16.1; Platelets 247; Potassium 4.1; Sodium 139    Lipid Panel No results found for: CHOL, TRIG, HDL, CHOLHDL, VLDL, LDLCALC, LDLDIRECT     Other studies Reviewed: Additional studies/ records that were reviewed today include: previous office notes, echo, DCCV.Marland Kitchen  ECHO: Study Conclusions  - Left ventricle: The cavity size was normal. There was mild focal basal hypertrophy of the septum. Systolic function was normal. The estimated ejection fraction was in the range of 60% to 65%. Wall motion was normal; there were no regional wall motion abnormalities. - Aortic valve: There was trivial regurgitation. - Mitral valve: There was mild regurgitation. - Left atrium: The atrium was severely dilated. - Right ventricle: The cavity size was normal. Wall thickness was normal. Systolic function was normal. - Inferior vena cava: The vessel was normal in size. The respirophasic diameter changes were in the normal range (>= 50%), consistent with normal central venous pressure.  ASSESSMENT AND PLAN:  1.  [name]--DCCV successfully to SR  Now back in a fib rate controlled. He feels well- he stated he did note improvement in how he felt after the DCCV.- discussed with Dr. Radford Pax- severely dilated LA,  Will send to a fib clinic for further recommendations.  Briefly discussed antiarrythmic and ablation.  He will make notes if he has SOB or awareness of HR.  Continue eliquis.   2. HTN - now with improved control with increase of the toprol.    Current medicines are reviewed with the patient today.  The patient Has no concerns regarding medicines.  The following changes have been made:  See above Labs/ tests ordered today include:see above  Disposition:   FU:  see above  Lennie Muckle, NP  11/02/2015 3:09 PM    [facility] [place], [place] [place], [place] Phone: [phone]; Fax: [phone]

## 2015-11-23 ENCOUNTER — Encounter (HOSPITAL_COMMUNITY): Payer: Self-pay | Admitting: Nurse Practitioner

## 2015-11-23 ENCOUNTER — Ambulatory Visit (HOSPITAL_COMMUNITY)
Admission: RE | Admit: 2015-11-23 | Discharge: 2015-11-23 | Disposition: A | Discharge status: Home / Self Care | Payer: PPO | Source: Ambulatory Visit | Attending: Nurse Practitioner | Admitting: Nurse Practitioner

## 2015-11-23 VITALS — BP 138/86 | HR 83 | Ht 68.0 in | Wt 174.2 lb

## 2015-11-23 DIAGNOSIS — I481 Persistent atrial fibrillation: Secondary | ICD-10-CM | POA: Insufficient documentation

## 2015-11-23 DIAGNOSIS — I4891 Unspecified atrial fibrillation: Secondary | ICD-10-CM

## 2015-11-23 MED ORDER — METOPROLOL SUCCINATE ER 50 MG PO TB24: ORAL_TABLET | ORAL | Status: DC

## 2015-11-23 MED ORDER — APIXABAN 5 MG PO TABS: 5.0000 mg | ORAL_TABLET | Freq: Two times a day (BID) | ORAL | Status: DC

## 2015-11-23 NOTE — Progress Notes (Signed)
Patient ID: Scott Newton. Scott Newton, male   DOB: 1946/04/26, 70 y.o.   MRN: [id number]     Primary Care Physician: London Pepper, MD Referring Physician: Dr. Ashley Jacobs C. Scott Newton is a 70 y.o. male with a h/o afib discovered on exam at time of getting established with PCP new to this areas last fall. He was unaware. He then was started on metoprolol and eliquis and had a cardioversion which was successful but had ERAF. He is in the afib clinic for f/u. He states that he could not tell any difference in SR as when he is in afib. He was surprised to be told that he had returned to Afib at f/u from cardioversion. He plays golf several times a week and can not tell any difference in his exercise tolerance and denies exertional dyspnea. He feels great. No bleeding issues with eliquis with a chadsvasc score of 2.  Today, he denies symptoms of palpitations, chest pain, shortness of breath, orthopnea, PND, lower extremity edema, dizziness, presyncope, syncope, or neurologic sequela. The patient is tolerating medications without difficulties and is otherwise without complaint today.   Past Medical History  Diagnosis Date  . Essential hypertension   . ED (erectile dysfunction)   . Umbilical hernia   . Persistent atrial fibrillation ([place])     a. Dx 08/2015.   Past Surgical History  Procedure Laterality Date  . Knee surgery Left   . Cardioversion N/A 10/19/2015    Procedure: CARDIOVERSION;  Surgeon: Josue Hector, MD;  Location: [facility] ENDOSCOPY;  Service: Cardiovascular;  Laterality: N/A;    Current Outpatient Prescriptions  Medication Sig Dispense Refill  . apixaban (ELIQUIS) 5 MG TABS tablet Take 1 tablet (5 mg total) by mouth 2 (two) times daily. 60 tablet 1  . metoprolol succinate (TOPROL-XL) 50 MG 24 hr tablet Take 1 1/2 tablet daily with or immediately following a meal. 135 tablet 3  . Multiple Vitamins-Minerals (MULTIVITAMIN WITH MINERALS) tablet Take 1 tablet by mouth daily.     No current  facility-administered medications for this encounter.    No Known Allergies  Social History   Social History  . Marital Status: Married    Spouse Name: N/A  . Number of Children: 3  . Years of Education: college   Occupational History  . unemployed    Social History Main Topics  . Smoking status: Current Some Day Smoker  . Smokeless tobacco: Never Used     Comment: Used to smoke cigarettes but quit, now smokes occasional cigar  . Alcohol Use: 0.0 oz/week    0 Standard drinks or equivalent per week     Comment: 2-3 glasses of wine several days a week  . Drug Use: No  . Sexual Activity: Not on file   Other Topics Concern  . Not on file   Social History Narrative    Family History  Problem Relation Age of Onset  . Heart disease Father 58  . Hypertension Mother 29  . Heart attack Father 98  . Stroke Neg Hx   . Heart attack Brother 70    ROS- All systems are reviewed and negative except as per the HPI above  Physical Exam: Filed Vitals:   11/23/15 0948  BP: 138/86  Pulse: 83  Height: 5\' 8"  (1.727 m)  Weight: 174 lb 3.2 oz (79.017 kg)    GEN- The patient is well appearing, alert and oriented x 3 today.   Head- normocephalic, atraumatic Eyes-  Sclera clear, conjunctiva  pink Ears- hearing intact Oropharynx- clear Neck- supple, no JVP Lymph- no cervical lymphadenopathy Lungs- Clear to ausculation bilaterally, normal work of breathing Heart- Regular rate and rhythm, no murmurs, rubs or gallops, PMI not laterally displaced GI- soft, NT, ND, + BS Extremities- no clubbing, cyanosis, or edema MS- no significant deformity or atrophy Skin- no rash or lesion Psych- euthymic mood, full affect Neuro- strength and sensation are intact  EKG-afib at 83 bpm, qrs int 78 ms, qtc 401 ms.  Echo- - Left ventricle: The cavity size was normal. There was mild focal basal hypertrophy of the septum. Systolic function was normal. The estimated ejection fraction was in the  range of 60% to 65%. Wall motion was normal; there were no regional wall motion abnormalities. - Aortic valve: There was trivial regurgitation. - Mitral valve: There was mild regurgitation. - Left atrium: The atrium was severely dilated, 43 mm - Right ventricle: The cavity size was normal. Wall thickness was normal. Systolic function was normal. - Inferior vena cava: The vessel was normal in size. The respirophasic diameter changes were in the normal range (>= 50%), consistent with normal central venous pressure.  Assessment and Plan:  1. Persistent asymptomatic rate controlled afib Options to manage afib discussed including rhythm control and rate control Since the pt is not symptomatic, he would prefer to continue the current course in rate controlled afib Continue eliquis for a chadsvasc score of 2 Bleeding precautions discussed Continue  metoprolol  2.Lifestyle issues contributing to afib Currently does not give a sleep apnea history Regular exercise encouraged Limit alcohol to no more than 2 a week  Limit caffeine  F/u in afib clinic in one month  Butch Penny C. Joeanthony Seeling, [facility] [address] [place], [place] [address] [phone]

## 2015-11-25 DIAGNOSIS — I4891 Unspecified atrial fibrillation: Secondary | ICD-10-CM | POA: Diagnosis not present

## 2015-11-25 DIAGNOSIS — Z23 Encounter for immunization: Secondary | ICD-10-CM | POA: Diagnosis not present

## 2015-11-25 DIAGNOSIS — N529 Male erectile dysfunction, unspecified: Secondary | ICD-10-CM | POA: Diagnosis not present

## 2015-11-25 DIAGNOSIS — N486 Induration penis plastica: Secondary | ICD-10-CM | POA: Diagnosis not present

## 2015-11-25 DIAGNOSIS — M6530 Trigger finger, unspecified finger: Secondary | ICD-10-CM | POA: Diagnosis not present

## 2015-11-25 DIAGNOSIS — Z0000 Encounter for general adult medical examination without abnormal findings: Secondary | ICD-10-CM | POA: Diagnosis not present

## 2015-11-25 DIAGNOSIS — I10 Essential (primary) hypertension: Secondary | ICD-10-CM | POA: Diagnosis not present

## 2015-11-30 DIAGNOSIS — Z136 Encounter for screening for cardiovascular disorders: Secondary | ICD-10-CM | POA: Diagnosis not present

## 2015-12-19 ENCOUNTER — Other Ambulatory Visit (HOSPITAL_COMMUNITY): Payer: Self-pay | Admitting: *Deleted

## 2015-12-19 MED ORDER — APIXABAN 5 MG PO TABS: 5.0000 mg | ORAL_TABLET | Freq: Two times a day (BID) | ORAL | Status: DC

## 2015-12-28 ENCOUNTER — Encounter (HOSPITAL_COMMUNITY): Payer: Self-pay | Admitting: Nurse Practitioner

## 2015-12-28 ENCOUNTER — Ambulatory Visit (HOSPITAL_COMMUNITY)
Admission: RE | Admit: 2015-12-28 | Discharge: 2015-12-28 | Disposition: A | Discharge status: Home / Self Care | Payer: PPO | Source: Ambulatory Visit | Attending: Nurse Practitioner | Admitting: Nurse Practitioner

## 2015-12-28 VITALS — BP 134/82 | HR 67 | Ht 68.0 in | Wt 179.0 lb

## 2015-12-28 DIAGNOSIS — I481 Persistent atrial fibrillation: Secondary | ICD-10-CM | POA: Diagnosis not present

## 2015-12-28 DIAGNOSIS — I4819 Other persistent atrial fibrillation: Secondary | ICD-10-CM

## 2015-12-28 DIAGNOSIS — I4891 Unspecified atrial fibrillation: Secondary | ICD-10-CM | POA: Insufficient documentation

## 2015-12-28 DIAGNOSIS — R9431 Abnormal electrocardiogram [ECG] [EKG]: Secondary | ICD-10-CM | POA: Insufficient documentation

## 2015-12-28 MED ORDER — APIXABAN 5 MG PO TABS: 5.0000 mg | ORAL_TABLET | Freq: Two times a day (BID) | ORAL | Status: DC

## 2015-12-28 NOTE — Progress Notes (Addendum)
Patient ID: Aaryan Congdon. [name], male   DOB: 07-01-1946, 70 y.o.   MRN: [id number]     Primary Care Physician: London Pepper, MD Referring Physician: Dr. Ashley Jacobs C. Ligocki is a 70 y.o. male with a h/o afib discovered on exam at time of getting established with PCP new to this areas last fall. He was unaware. He then was started on metoprolol and eliquis and had a cardioversion which was successful but had ERAF. He was seen in the afib clinic for f/u. He states that he could not tell any difference in SR as when he was in afib. He was surprised to be told that he had returned to Afib at f/u from cardioversion. He plays golf several times a week and can not tell any difference in his exercise tolerance and denies exertional dyspnea. He feels great. No bleeding issues with eliquis with a chadsvasc score of 2.  He returns to afib clinic 2/8 and still reports that he does not feel any symptoms of irregular heart bet and his exercise tolerance is very good. He is being compliant with metoprolol and DOAC. He has cut back on his alcohol and caffeine.   Today, he denies symptoms of palpitations, chest pain, shortness of breath, orthopnea, PND, lower extremity edema, dizziness, presyncope, syncope, or neurologic sequela. The patient is tolerating medications without difficulties and is otherwise without complaint today.   Past Medical History  Diagnosis Date  . Essential hypertension   . ED (erectile dysfunction)   . Umbilical hernia   . Persistent atrial fibrillation ([place])     a. Dx 08/2015.   Past Surgical History  Procedure Laterality Date  . Knee surgery Left   . Cardioversion N/A 10/19/2015    Procedure: CARDIOVERSION;  Surgeon: Josue Hector, MD;  Location: [facility] ENDOSCOPY;  Service: Cardiovascular;  Laterality: N/A;    Current Outpatient Prescriptions  Medication Sig Dispense Refill  . apixaban (ELIQUIS) 5 MG TABS tablet Take 1 tablet (5 mg total) by mouth 2 (two) times daily. 180 tablet 3    . metoprolol succinate (TOPROL-XL) 50 MG 24 hr tablet Take 1 1/2 tablet daily with or immediately following a meal. 135 tablet 3  . Multiple Vitamins-Minerals (MULTIVITAMIN WITH MINERALS) tablet Take 1 tablet by mouth daily.     No current facility-administered medications for this encounter.    No Known Allergies  Social History   Social History  . Marital Status: Married    Spouse Name: N/A  . Number of Children: 3  . Years of Education: college   Occupational History  . unemployed    Social History Main Topics  . Smoking status: Current Some Day Smoker  . Smokeless tobacco: Never Used     Comment: Used to smoke cigarettes but quit, now smokes occasional cigar  . Alcohol Use: 0.0 oz/week    0 Standard drinks or equivalent per week     Comment: 2-3 glasses of wine several days a week  . Drug Use: No  . Sexual Activity: Not on file   Other Topics Concern  . Not on file   Social History Narrative    Family History  Problem Relation Age of Onset  . Heart disease Father 59  . Hypertension Mother 68  . Heart attack Father 49  . Stroke Neg Hx   . Heart attack Brother 83    ROS- All systems are reviewed and negative except as per the HPI above  Physical Exam: Filed Vitals:  12/28/15 0857  BP: 134/82  Pulse: 67  Height: 5\' 8"  (1.727 m)  Weight: 179 lb (81.194 kg)    GEN- The patient is well appearing, alert and oriented x 3 today.   Head- normocephalic, atraumatic Eyes-  Sclera clear, conjunctiva pink Ears- hearing intact Oropharynx- clear Neck- supple, no JVP Lymph- no cervical lymphadenopathy Lungs- Clear to ausculation bilaterally, normal work of breathing Heart- Regular rate and rhythm, no murmurs, rubs or gallops, PMI not laterally displaced GI- soft, NT, ND, + BS Extremities- no clubbing, cyanosis, or edema MS- no significant deformity or atrophy Skin- no rash or lesion Psych- euthymic mood, full affect Neuro- strength and sensation are  intact  EKG-afib at 67 bpm, qrs int 80 ms, qtc 384 ms.  Echo- - Left ventricle: The cavity size was normal. There was mild focal basal hypertrophy of the septum. Systolic function was normal. The estimated ejection fraction was in the range of 60% to 65%. Wall motion was normal; there were no regional wall motion abnormalities. - Aortic valve: There was trivial regurgitation. - Mitral valve: There was mild regurgitation. - Left atrium: The atrium was severely dilated, 43 mm - Right ventricle: The cavity size was normal. Wall thickness was normal. Systolic function was normal. - Inferior vena cava: The vessel was normal in size. The respirophasic diameter changes were in the normal range (>= 50%), consistent with normal central venous pressure.  Assessment and Plan:  1. Persistent asymptomatic rate controlled afib Options to manage afib discussed including rhythm control and rate control Since the pt is not symptomatic, he would prefer to continue the current course in rate controlled afib Continue eliquis for a chadsvasc score of 2 Bleeding precautions discussed Continue  metoprolol  2.Lifestyle issues contributing to afib Currently does not give a sleep apnea history Regular exercise encouraged Limit alcohol to no more than 2 a week  Limit caffeine  F/u with Dr. Radford Pax as scheduled on recall in May.  Geroge Baseman Zackary Mckeone, [facility] [address] [place], [place] [address] [phone]

## 2015-12-28 NOTE — Addendum Note (Signed)
Encounter addended by: Sherran Needs, NP on: 12/28/2015 10:01 AM<BR>     Documentation filed: Notes Section

## 2016-01-30 ENCOUNTER — Telehealth: Payer: Self-pay | Admitting: Cardiology

## 2016-02-02 NOTE — Telephone Encounter (Signed)
Patient st he is having dental surgery and dentist is requesting a "treatment release form" from Dr. Radford Pax. Patient to drop off clearance at front desk tomorrow for Dr. Radford Pax to review.

## 2016-02-02 NOTE — Telephone Encounter (Signed)
New message ° ° ° ° ° °Talk to the nurse °

## 2016-02-15 DIAGNOSIS — N138 Other obstructive and reflux uropathy: Secondary | ICD-10-CM | POA: Diagnosis not present

## 2016-02-15 DIAGNOSIS — R3912 Poor urinary stream: Secondary | ICD-10-CM | POA: Diagnosis not present

## 2016-02-15 DIAGNOSIS — N401 Enlarged prostate with lower urinary tract symptoms: Secondary | ICD-10-CM | POA: Diagnosis not present

## 2016-02-15 DIAGNOSIS — N486 Induration penis plastica: Secondary | ICD-10-CM | POA: Diagnosis not present

## 2016-02-15 DIAGNOSIS — N5201 Erectile dysfunction due to arterial insufficiency: Secondary | ICD-10-CM | POA: Diagnosis not present

## 2016-04-12 ENCOUNTER — Ambulatory Visit (INDEPENDENT_AMBULATORY_CARE_PROVIDER_SITE_OTHER): Payer: PPO | Admitting: Cardiology

## 2016-04-12 ENCOUNTER — Encounter: Payer: Self-pay | Admitting: Cardiology

## 2016-04-12 VITALS — BP 140/86 | HR 52 | Ht 68.0 in | Wt 177.8 lb

## 2016-04-12 DIAGNOSIS — I10 Essential (primary) hypertension: Secondary | ICD-10-CM

## 2016-04-12 DIAGNOSIS — I482 Chronic atrial fibrillation, unspecified: Secondary | ICD-10-CM

## 2016-04-12 LAB — CBC WITH DIFFERENTIAL/PLATELET
BASOS PCT: 0 %
Basophils Absolute: 0 cells/uL (ref 0–200)
EOS PCT: 3 %
Eosinophils Absolute: 195 cells/uL (ref 15–500)
HCT: 46.5 % (ref 38.5–50.0)
Hemoglobin: 15.7 g/dL (ref 13.2–17.1)
Lymphocytes Relative: 32 %
Lymphs Abs: 2080 cells/uL (ref 850–3900)
MCH: 30.7 pg (ref 27.0–33.0)
MCHC: 33.8 g/dL (ref 32.0–36.0)
MCV: 90.8 fL (ref 80.0–100.0)
MONOS PCT: 13 %
MPV: 10.4 fL (ref 7.5–12.5)
Monocytes Absolute: 845 cells/uL (ref 200–950)
NEUTROS ABS: 3380 {cells}/uL (ref 1500–7800)
Neutrophils Relative %: 52 %
PLATELETS: 240 10*3/uL (ref 140–400)
RBC: 5.12 MIL/uL (ref 4.20–5.80)
RDW: 13.2 % (ref 11.0–15.0)
WBC: 6.5 10*3/uL (ref 3.8–10.8)

## 2016-04-12 LAB — BASIC METABOLIC PANEL
BUN: 16 mg/dL (ref 7–25)
CALCIUM: 8.9 mg/dL (ref 8.6–10.3)
CO2: 30 mmol/L (ref 20–31)
CREATININE: 0.92 mg/dL (ref 0.70–1.25)
Chloride: 103 mmol/L (ref 98–110)
Glucose, Bld: 78 mg/dL (ref 65–99)
Potassium: 4.8 mmol/L (ref 3.5–5.3)
SODIUM: 139 mmol/L (ref 135–146)

## 2016-04-12 NOTE — Progress Notes (Signed)
Cardiology Office Note    Date:  04/12/2016   ID:  Scott Newton, Scott Newton 10-19-1946, MRN [id number]  PCP:  London Pepper, MD  Cardiologist:  Fransico Him, MD   Chief Complaint  Patient presents with  . Atrial Fibrillation  . Hypertension    History of Present Illness:  Scott Newton is a 70 y.o. male who presents for followup of atrial fibrillation. [name] and palpitations and was found to have new onset atrial fibrillation by his PCP on 08/31/2015. He was found to be in atrial fibrillation and was started on Eliquis.  He underwent DCCV but had reoccurrent afib.  He was seen in afib clinic.  Since he was asymptomatic he opted for rate control.  He is doing well today.   He denies any chest pain, SOB, DOE, LE edema, dizziness or syncope.     Past Medical History  Diagnosis Date  . Essential hypertension   . ED (erectile dysfunction)   . Umbilical hernia   . Chronic atrial fibrillation ([place]) 08/2015    s/p DCCV with reversion back to afib now pursuing rate control.    Past Surgical History  Procedure Laterality Date  . Knee surgery Left   . Cardioversion N/A 10/19/2015    Procedure: CARDIOVERSION;  Surgeon: Josue Hector, MD;  Location: [facility] ENDOSCOPY;  Service: Cardiovascular;  Laterality: N/A;    Current Medications: Outpatient Prescriptions Prior to Visit  Medication Sig Dispense Refill  . apixaban (ELIQUIS) 5 MG TABS tablet Take 1 tablet (5 mg total) by mouth 2 (two) times daily. 180 tablet 3  . metoprolol succinate (TOPROL-XL) 50 MG 24 hr tablet Take 1 1/2 tablet daily with or immediately following a meal. 135 tablet 3  . Multiple Vitamins-Minerals (MULTIVITAMIN WITH MINERALS) tablet Take 1 tablet by mouth daily.     No facility-administered medications prior to visit.     Allergies:   Review of patient's allergies indicates no known allergies.   Social History   Social History  . Marital Status: Married    Spouse Name: N/A  . Number of  Children: 3  . Years of Education: college   Occupational History  . unemployed    Social History Main Topics  . Smoking status: Current Some Day Smoker    Types: Cigars  . Smokeless tobacco: Never Used     Comment: Used to smoke cigarettes but quit, now smokes occasional cigar  . Alcohol Use: 0.0 oz/week    0 Standard drinks or equivalent per week     Comment: 2-3 glasses of wine several days a week  . Drug Use: No  . Sexual Activity: Not Asked   Other Topics Concern  . None   Social History Narrative     Family History:  The patient's family history includes Heart attack (age of onset: 40) in his father; Heart attack (age of onset: 64) in his brother; Heart disease (age of onset: 7) in his father; Hypertension (age of onset: 74) in his mother. There is no history of Stroke.   ROS:   Please see the history of present illness.    ROS All other systems reviewed and are negative.   PHYSICAL EXAM:   VS:  BP 140/86 mmHg  Pulse 52  Ht 5\' 8"  (1.727 m)  Wt 177 lb 12.8 oz (80.65 kg)  BMI 27.04 kg/m2  SpO2 99%   GEN: Well nourished, well developed, in no acute distress HEENT: normal Neck: no  JVD, carotid bruits, or masses Cardiac: RRR; no murmurs, rubs, or gallops,no edema.  Intact distal pulses bilaterally.  Respiratory:  clear to auscultation bilaterally, normal work of breathing GI: soft, nontender, nondistended, + BS MS: no deformity or atrophy Skin: warm and dry, no rash Neuro:  Alert and Oriented x 3, Strength and sensation are intact Psych: euthymic mood, full affect  Wt Readings from Last 3 Encounters:  04/12/16 177 lb 12.8 oz (80.65 kg)  12/28/15 179 lb (81.194 kg)  11/23/15 174 lb 3.2 oz (79.017 kg)      Studies/Labs Reviewed:   EKG:  EKG is not ordered today.    Recent Labs: 09/15/2015: TSH 1.542 10/17/2015: BUN 16; Creat 0.95; Hemoglobin 16.1; Platelets 247; Potassium 4.1; Sodium 139   Lipid Panel No results found for: CHOL, TRIG, HDL, CHOLHDL,  VLDL, LDLCALC, LDLDIRECT  Additional studies/ records that were reviewed today include:      ASSESSMENT:    1. Chronic atrial fibrillation ([place])   2. Essential hypertension      PLAN:  In order of problems listed above:  1. Chronic atrial fibrillation - rate controlled on current medical regimen.  Continue BB and Eliquis.  Check NOAC. 2. HTN - borderline controlled on meds.  He ate [employer] last night.  Continue BB.    Medication Adjustments/Labs and Tests Ordered: Current medicines are reviewed at length with the patient today.  Concerns regarding medicines are outlined above.  Medication changes, Labs and Tests ordered today are listed in the Patient Instructions below.  There are no Patient Instructions on file for this visit.   Signed, Fransico Him, MD  04/12/2016 8:34 AM    [facility] [address], [place], [place]  [address] Phone: [phone]; Fax: [phone]

## 2016-04-12 NOTE — Patient Instructions (Signed)

## 2016-07-18 DIAGNOSIS — H5203 Hypermetropia, bilateral: Secondary | ICD-10-CM | POA: Diagnosis not present

## 2016-08-24 DIAGNOSIS — Z23 Encounter for immunization: Secondary | ICD-10-CM | POA: Diagnosis not present

## 2016-10-03 ENCOUNTER — Telehealth: Payer: Self-pay | Admitting: Cardiology

## 2016-10-03 NOTE — Telephone Encounter (Signed)
pt has refills of eliquis 5 mg at the pharmacy,  pt really called needing samples, explained that i can give 2 weeks & PA form, pt expressed understanding and willingness to apply for help. samples placed up front.

## 2016-10-03 NOTE — Telephone Encounter (Signed)
New message  RX needed  eliquis 5mg  1tab 2xday  [facility] [place]

## 2016-10-03 NOTE — Telephone Encounter (Signed)
New message pt is needing samples  eliquis 5mg  1tab 2xday  Please call pt if samples are available to pick  Runs out 11/23  appt is on 12/5

## 2016-10-04 ENCOUNTER — Encounter: Payer: Self-pay | Admitting: Cardiology

## 2016-10-16 ENCOUNTER — Encounter: Payer: Self-pay | Admitting: Cardiology

## 2016-10-22 ENCOUNTER — Ambulatory Visit: Payer: PPO | Admitting: Cardiology

## 2016-10-23 ENCOUNTER — Ambulatory Visit: Payer: PPO | Admitting: Cardiology

## 2016-10-24 ENCOUNTER — Encounter: Payer: Self-pay | Admitting: Cardiology

## 2016-10-24 ENCOUNTER — Telehealth: Payer: Self-pay | Admitting: Cardiology

## 2016-10-24 ENCOUNTER — Ambulatory Visit (INDEPENDENT_AMBULATORY_CARE_PROVIDER_SITE_OTHER): Payer: PPO | Admitting: Cardiology

## 2016-10-24 VITALS — BP 130/88 | HR 94 | Ht 68.5 in | Wt 180.1 lb

## 2016-10-24 DIAGNOSIS — I482 Chronic atrial fibrillation, unspecified: Secondary | ICD-10-CM

## 2016-10-24 NOTE — Progress Notes (Signed)
10/24/2016 Scott Newton. Scott Newton   Oct 15, 1946  [id number]  Primary Physician Scott Pepper, MD Primary Cardiologist: Dr. Radford Newton   Reason for Visit/CC: 6 month f/u for Atrial Fibrillation   HPI:  Scott Newton presents to clinic today for 6 month f/u. He is followed by Dr. Radford Newton. He is a 70 y.o. male who presents for followup for atrial fibrillation. [name] and palpitations and was found to have new onset atrial fibrillation by his PCP on 08/31/2015. He was found to be in atrial fibrillation and was started on Eliquis.  He underwent DCCV but had reoccurrent afib.  He was seen in afib clinic.  Since he was asymptomatic he opted for rate control. He is on metoprolol XL- 50 mg daily. 2D echo 11/16 showed normal LVEF and wall motion. EF was 60-65%.    He reports that he has done well since his last OV. He remains fairly asymptomatic. No palpiations, dyspnea, CP, dizziness, syncope/ near syncope. HR and BP are both controlled. He reports full medication compliance. He is tolerating Eliquis w/o issues of abnormal bleeding. His only issue is cost. He is currently in the donut hole. He is requesting samples to help assist until his insurance renews the first of the year.   Current Meds  Medication Sig  . apixaban (ELIQUIS) 5 MG TABS tablet Take 1 tablet (5 mg total) by mouth 2 (two) times daily.  . metoprolol succinate (TOPROL-XL) 50 MG 24 hr tablet Take 1 1/2 tablet daily with or immediately following a meal.  . Multiple Vitamins-Minerals (MULTIVITAMIN WITH MINERALS) tablet Take 1 tablet by mouth daily.   No Known Allergies Past Medical History:  Diagnosis Date  . Chronic atrial fibrillation ([place]) 08/2015   s/p DCCV with reversion back to afib now pursuing rate control.  . ED (erectile dysfunction)   . Essential hypertension   . Umbilical hernia    Family History  Problem Relation Age of Onset  . Heart disease Father 77  . Heart attack Father 46  . Hypertension Mother 20  . Heart  attack Brother 81  . Stroke Neg Hx    Past Surgical History:  Procedure Laterality Date  . CARDIOVERSION N/A 10/19/2015   Procedure: CARDIOVERSION;  Surgeon: Scott Hector, MD;  Location: [facility] ENDOSCOPY;  Service: Cardiovascular;  Laterality: N/A;  . KNEE SURGERY Left    Social History   Social History  . Marital status: Married    Spouse name: N/A  . Number of children: 3  . Years of education: college   Occupational History  . unemployed    Social History Main Topics  . Smoking status: Current Some Day Smoker    Types: Cigars  . Smokeless tobacco: Never Used     Comment: Used to smoke cigarettes but quit, now smokes occasional cigar  . Alcohol use 0.0 oz/week     Comment: 2-3 glasses of wine several days a week  . Drug use: No  . Sexual activity: Not on file   Other Topics Concern  . Not on file   Social History Narrative  . No narrative on file     Review of Systems: General: negative for chills, fever, night sweats or weight changes.  Cardiovascular: negative for chest pain, dyspnea on exertion, edema, orthopnea, palpitations, paroxysmal nocturnal dyspnea or shortness of breath Dermatological: negative for rash Respiratory: negative for cough or wheezing Urologic: negative for hematuria Abdominal: negative for nausea, vomiting, diarrhea, bright red blood per rectum, melena,  or hematemesis Neurologic: negative for visual changes, syncope, or dizziness All other systems reviewed and are otherwise negative except as noted above.   Physical Exam:  Blood pressure 130/88, pulse 94, height 5' 8.5" (1.74 m), weight 180 lb 1.9 oz (81.7 kg), SpO2 98 %.  General appearance: alert, cooperative and no distress Neck: no carotid bruit and no JVD Lungs: clear to auscultation bilaterally Heart: irregularly irregular rhythm,  Regular rate, S1, S2 normal, no murmur, click, rub or gallop Extremities: extremities normal, atraumatic, no cyanosis or edema Pulses: 2+ and  symmetric Skin: Skin color, texture, turgor normal. No rashes or lesions Neurologic: Grossly normal  EKG Not performed  ASSESSMENT AND PLAN:   1. Chronic Atrial Fibrillation: remains asymptomatic. Continue rate control strategy with metoprolol. Continue Eliquis for a/c.  2. HTN: well controlled with metoprolol.    PLAN  F/u with Dr. Radford Newton in 6 months  Scott Newton [facility] 10/24/2016 8:24 AM

## 2016-10-24 NOTE — Telephone Encounter (Signed)
Theda Sers, RN, covering for Ellen Henri, [place], needed samples of Eliquis 5 mg tablet for this pt, because pt is in the donut hole. I gave pt 2 boxes of Eliquis 5 mg tablet.   LOT # [id number]   EXP: 3/20

## 2016-10-24 NOTE — Patient Instructions (Signed)
Medication Instructions:  Your physician recommends that you continue on your current medications as directed. Please refer to the Current Medication list given to you today.  Labwork: NONE  Testing/Procedures: NONE  Follow-Up: Your physician wants you to follow-up in: 6 months with Dr. Turner. You will receive a reminder letter in the mail two months in advance. If you don't receive a letter, please call our office to schedule the follow-up appointment.   If you need a refill on your cardiac medications before your next appointment, please call your pharmacy.    

## 2016-11-06 ENCOUNTER — Other Ambulatory Visit: Payer: Self-pay | Admitting: Physician Assistant

## 2016-12-26 DIAGNOSIS — I10 Essential (primary) hypertension: Secondary | ICD-10-CM | POA: Diagnosis not present

## 2016-12-26 DIAGNOSIS — I4891 Unspecified atrial fibrillation: Secondary | ICD-10-CM | POA: Diagnosis not present

## 2016-12-26 DIAGNOSIS — Z0000 Encounter for general adult medical examination without abnormal findings: Secondary | ICD-10-CM | POA: Diagnosis not present

## 2016-12-26 DIAGNOSIS — N486 Induration penis plastica: Secondary | ICD-10-CM | POA: Diagnosis not present

## 2017-02-18 DIAGNOSIS — N5201 Erectile dysfunction due to arterial insufficiency: Secondary | ICD-10-CM | POA: Diagnosis not present

## 2017-02-18 DIAGNOSIS — Z125 Encounter for screening for malignant neoplasm of prostate: Secondary | ICD-10-CM | POA: Diagnosis not present

## 2017-02-18 DIAGNOSIS — R3912 Poor urinary stream: Secondary | ICD-10-CM | POA: Diagnosis not present

## 2017-02-18 DIAGNOSIS — N401 Enlarged prostate with lower urinary tract symptoms: Secondary | ICD-10-CM | POA: Diagnosis not present

## 2017-02-21 ENCOUNTER — Other Ambulatory Visit (HOSPITAL_COMMUNITY): Payer: Self-pay | Admitting: Nurse Practitioner

## 2017-04-21 NOTE — Progress Notes (Signed)
Cardiology Office Note    Date:  04/22/2017   ID:  Scott Newton, Scott Newton 06-03-1946, MRN [id number]  PCP:  London Pepper, MD  Cardiologist:  Fransico Him, MD   Chief Complaint  Patient presents with  . Follow-up  . Atrial Fibrillation  . Hypertension    History of Present Illness:  Scott Newton is a 71 y.o. 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  2D echo 11/16 showed normal LVEF and wall motion. EF was 60-65%.  He is on chronic Eliquis for CHADS2VASC score of 2.    He is here today for followup and is doing well.  He denies any chest pain or pressure, SOB, DOE, palpitations, PND, orthopnea, LE edema or syncope.  Occasionally he will have dizziness when his BP runs high.  Past Medical History:  Diagnosis Date  . Chronic atrial fibrillation ([place]) 08/2015   s/p DCCV with reversion back to afib now pursuing rate control.  . ED (erectile dysfunction)   . Essential hypertension   . Umbilical hernia     Past Surgical History:  Procedure Laterality Date  . CARDIOVERSION N/A 10/19/2015   Procedure: CARDIOVERSION;  Surgeon: Josue Hector, MD;  Location: [facility] ENDOSCOPY;  Service: Cardiovascular;  Laterality: N/A;  . KNEE SURGERY Left     Current Medications: Current Meds  Medication Sig  . ELIQUIS 5 MG TABS tablet TAKE 1 TABLET BY MOUTH TWICE A DAY  . metoprolol succinate (TOPROL-XL) 50 MG 24 hr tablet Take 1 1/2 tablet daily with or immediately following a meal.  . Multiple Vitamins-Minerals (MULTIVITAMIN WITH MINERALS) tablet Take 1 tablet by mouth daily.    Allergies:   Patient has no known allergies.   Social History   Social History  . Marital status: Married    Spouse name: N/A  . Number of children: 3  . Years of education: college   Occupational History  . unemployed    Social History Main Topics  . Smoking status: Current Some Day Smoker    Types: Cigars    . Smokeless tobacco: Never Used     Comment: Used to smoke cigarettes but quit, now smokes occasional cigar  . Alcohol use 0.0 oz/week     Comment: 2-3 glasses of wine several days a week  . Drug use: No  . Sexual activity: Not Asked   Other Topics Concern  . None   Social History Narrative  . None     Family History:  The patient's family history includes Heart attack (age of onset: 56) in his father; Heart attack (age of onset: 76) in his brother; Heart disease (age of onset: 28) in his father; Hypertension (age of onset: 76) in his mother.   ROS:   Please see the history of present illness.    ROS All other systems reviewed and are negative.  No flowsheet data found.     PHYSICAL EXAM:   VS:  BP (!) 146/90   Pulse 82   Ht 5\' 8"  (1.727 m)   Wt 177 lb 12.8 oz (80.6 kg)   BMI 27.03 kg/m    GEN: Well nourished, well developed, in no acute distress  HEENT: normal  Neck: no JVD, carotid bruits, or masses Cardiac: RRR; no murmurs, rubs, or gallops,no edema.  Intact distal pulses bilaterally.  Respiratory:  clear to auscultation bilaterally, normal work of breathing GI: soft, nontender, nondistended, +  BS MS: no deformity or atrophy  Skin: warm and dry, no rash Neuro:  Alert and Oriented x 3, Strength and sensation are intact Psych: euthymic mood, full affect  Wt Readings from Last 3 Encounters:  04/22/17 177 lb 12.8 oz (80.6 kg)  10/24/16 180 lb 1.9 oz (81.7 kg)  04/12/16 177 lb 12.8 oz (80.6 kg)      Studies/Labs Reviewed:   EKG:  EKG is ordered today.  The ekg ordered today demonstrates atrial fibrillation with CVR at 82bpm.    Recent Labs: No results found for requested labs within last 8760 hours.   Lipid Panel No results found for: CHOL, TRIG, HDL, CHOLHDL, VLDL, LDLCALC, LDLDIRECT  Additional studies/ records that were reviewed today include:  none    ASSESSMENT:    1. Chronic atrial fibrillation ([place])   2. Essential hypertension      PLAN:   In order of problems listed above:  1. Chronic Atrial Fibrillation: He is well rate controlled.  He is asymptomatic.  He will continue on rate control strategy with metoprolol. Continue Eliquis for CHADS2VASC score of 2. I will check a NOAC panel.   2. HTN: His BP is borderline controlled on exam today and he will continue on metoprolol. I will get a 24 hour BP monitor to assess.    Medication Adjustments/Labs and Tests Ordered: Current medicines are reviewed at length with the patient today.  Concerns regarding medicines are outlined above.  Medication changes, Labs and Tests ordered today are listed in the Patient Instructions below.  There are no Patient Instructions on file for this visit.   Signed, Fransico Him, MD  04/22/2017 8:21 AM    [facility] [place], [place], [place]  [address] Phone: [phone]; Fax: [phone]

## 2017-04-22 ENCOUNTER — Encounter: Payer: Self-pay | Admitting: Cardiology

## 2017-04-22 ENCOUNTER — Ambulatory Visit (INDEPENDENT_AMBULATORY_CARE_PROVIDER_SITE_OTHER): Payer: PPO | Admitting: Cardiology

## 2017-04-22 VITALS — BP 146/90 | HR 82 | Ht 68.0 in | Wt 177.8 lb

## 2017-04-22 DIAGNOSIS — I482 Chronic atrial fibrillation, unspecified: Secondary | ICD-10-CM

## 2017-04-22 DIAGNOSIS — I10 Essential (primary) hypertension: Secondary | ICD-10-CM

## 2017-04-22 NOTE — Patient Instructions (Signed)
Medication Instructions:  Your physician recommends that you continue on your current medications as directed. Please refer to the Current Medication list given to you today.   Labwork: TODAY: BMET, CBC  Testing/Procedures: Dr. Radford Pax recommends you have a 24 hour blood pressure monitor.  Follow-Up: Your physician wants you to follow-up in: 1 year with Dr. Radford Pax. You will receive a reminder letter in the mail two months in advance. If you don't receive a letter, please call our office to schedule the follow-up appointment.   Any Other Special Instructions Will Be Listed Below (If Applicable).     If you need a refill on your cardiac medications before your next appointment, please call your pharmacy.

## 2017-04-23 LAB — CBC WITH DIFFERENTIAL/PLATELET
BASOS ABS: 0 10*3/uL (ref 0.0–0.2)
Basos: 1 %
EOS (ABSOLUTE): 0.1 10*3/uL (ref 0.0–0.4)
Eos: 3 %
HEMATOCRIT: 46.7 % (ref 37.5–51.0)
HEMOGLOBIN: 15.7 g/dL (ref 13.0–17.7)
IMMATURE GRANS (ABS): 0 10*3/uL (ref 0.0–0.1)
Immature Granulocytes: 0 %
LYMPHS: 28 %
Lymphocytes Absolute: 1.3 10*3/uL (ref 0.7–3.1)
MCH: 31 pg (ref 26.6–33.0)
MCHC: 33.6 g/dL (ref 31.5–35.7)
MCV: 92 fL (ref 79–97)
MONOCYTES: 13 %
Monocytes Absolute: 0.6 10*3/uL (ref 0.1–0.9)
NEUTROS ABS: 2.7 10*3/uL (ref 1.4–7.0)
Neutrophils: 55 %
Platelets: 224 10*3/uL (ref 150–379)
RBC: 5.07 x10E6/uL (ref 4.14–5.80)
RDW: 13.4 % (ref 12.3–15.4)
WBC: 4.9 10*3/uL (ref 3.4–10.8)

## 2017-04-23 LAB — BASIC METABOLIC PANEL
BUN/Creatinine Ratio: 20 (ref 10–24)
BUN: 18 mg/dL (ref 8–27)
CALCIUM: 9.2 mg/dL (ref 8.6–10.2)
CHLORIDE: 102 mmol/L (ref 96–106)
CO2: 21 mmol/L (ref 18–29)
Creatinine, Ser: 0.92 mg/dL (ref 0.76–1.27)
GFR calc non Af Amer: 84 mL/min/{1.73_m2} (ref >59)
GFR, EST AFRICAN AMERICAN: 97 mL/min/{1.73_m2} (ref >59)
GLUCOSE: 87 mg/dL (ref 65–99)
POTASSIUM: 5.1 mmol/L (ref 3.5–5.2)
Sodium: 140 mmol/L (ref 134–144)

## 2017-05-13 ENCOUNTER — Telehealth: Payer: Self-pay | Admitting: Cardiology

## 2017-05-13 NOTE — Telephone Encounter (Signed)
Spoke with patient.  Needed to cancel his appointment for BP monitor for tomorrow.  Also, he purchased a cuff and will monitor his BP at home and forward via MyChart readings to Dr. Radford Pax.  The following readings were taking prior to his Toprol XL, which he takes every AM. 124/86, 138/86, 126/86, 121/87, 138/74, 118/79  He will monitor in the afternoon or evening for another week and will forward those readings to Dr. Radford Pax to review.

## 2017-05-13 NOTE — Telephone Encounter (Signed)
New message    Pt verbalized that he is calling to speak to rn about the appt he has tomorrow for his BP check  He did not disclose anymore information

## 2017-05-20 ENCOUNTER — Encounter: Payer: Self-pay | Admitting: Cardiology

## 2017-06-18 DIAGNOSIS — L280 Lichen simplex chronicus: Secondary | ICD-10-CM | POA: Diagnosis not present

## 2017-06-18 DIAGNOSIS — M5412 Radiculopathy, cervical region: Secondary | ICD-10-CM | POA: Diagnosis not present

## 2017-10-28 ENCOUNTER — Other Ambulatory Visit: Payer: Self-pay | Admitting: Physician Assistant

## 2018-01-03 DIAGNOSIS — Z0000 Encounter for general adult medical examination without abnormal findings: Secondary | ICD-10-CM | POA: Diagnosis not present

## 2018-01-03 DIAGNOSIS — I4891 Unspecified atrial fibrillation: Secondary | ICD-10-CM | POA: Diagnosis not present

## 2018-01-03 DIAGNOSIS — K429 Umbilical hernia without obstruction or gangrene: Secondary | ICD-10-CM | POA: Diagnosis not present

## 2018-01-03 DIAGNOSIS — R3989 Other symptoms and signs involving the genitourinary system: Secondary | ICD-10-CM | POA: Diagnosis not present

## 2018-01-03 DIAGNOSIS — I10 Essential (primary) hypertension: Secondary | ICD-10-CM | POA: Diagnosis not present

## 2018-01-03 DIAGNOSIS — Z23 Encounter for immunization: Secondary | ICD-10-CM | POA: Diagnosis not present

## 2018-01-20 DIAGNOSIS — R310 Gross hematuria: Secondary | ICD-10-CM | POA: Diagnosis not present

## 2018-01-24 DIAGNOSIS — K429 Umbilical hernia without obstruction or gangrene: Secondary | ICD-10-CM | POA: Diagnosis not present

## 2018-01-28 DIAGNOSIS — R319 Hematuria, unspecified: Secondary | ICD-10-CM | POA: Diagnosis not present

## 2018-01-28 DIAGNOSIS — R310 Gross hematuria: Secondary | ICD-10-CM | POA: Diagnosis not present

## 2018-02-12 DIAGNOSIS — R3912 Poor urinary stream: Secondary | ICD-10-CM | POA: Diagnosis not present

## 2018-02-12 DIAGNOSIS — N401 Enlarged prostate with lower urinary tract symptoms: Secondary | ICD-10-CM | POA: Diagnosis not present

## 2018-02-12 DIAGNOSIS — R310 Gross hematuria: Secondary | ICD-10-CM | POA: Diagnosis not present

## 2018-02-26 ENCOUNTER — Telehealth: Payer: Self-pay | Admitting: Cardiology

## 2018-02-26 ENCOUNTER — Other Ambulatory Visit: Payer: Self-pay | Admitting: Urology

## 2018-02-26 NOTE — Telephone Encounter (Signed)
New Message:        [facility] Pre-operative Risk Assessment    Request for surgical clearance:  1. What type of surgery is being performed? Urolift    2. When is this surgery scheduled? 04/07/2018  3. What type of clearance is required (medical clearance vs. Pharmacy clearance to hold med vs. Both)? Both  4. Are there any medications that need to be held prior to surgery and how long? Needs to be held 3 days before ELIQUIS 5 MG TABS tablet  5. Practice name and name of physician performing surgery? [facility]/ Dr. Festus Aloe   6. What is your office phone number  [phone] ext [phone].   What is your office fax number [phone]  8.   Anesthesia type (None, local, MAC, general) ? General   Maricela Bo Pulliam 02/26/2018, 9:47 AM  _________________________________________________________________   (provider comments below)

## 2018-02-27 NOTE — Telephone Encounter (Signed)
Left detailed message that pt needs an o/v before he can be cleared for his upcoming procedure, and for him to call the office and get that scheduled.

## 2018-02-27 NOTE — Telephone Encounter (Signed)
   Primary Cardiologist:No primary care provider on file.  Chart reviewed as part of pre-operative protocol coverage. Because of Scott Newton's past medical history and time since last visit, he/she will require a follow-up visit in order to better assess preoperative cardiovascular risk.  Pre-op covering staff: - Please schedule appointment and call patient to inform them. - Please contact requesting surgeon's office via preferred method (i.e, phone, fax) to inform them of need for appointment prior to surgery.  Cecilie Kicks, NP  02/27/2018, 4:32 PM

## 2018-03-03 NOTE — Telephone Encounter (Signed)
Left message to call back  

## 2018-03-05 NOTE — Telephone Encounter (Signed)
Left message to call back  

## 2018-03-06 NOTE — Telephone Encounter (Signed)
[facility]  ON HOME  TO CONTACT OFFICE BACK FOR PRE OP CLEARANCE APPT.  CONTACTED CELL NUMBER AND PT ANSWERED PT IS CURRENTLY IN [place] AND WOULD SOME TO CONTACT HIM BACK 03-10-18.  PT WAS TOLD THAT IT MAY BE  A POSSIBLITY TO GET PROCEDEURE RESCHEDULED IF APPT IS NOT AVAILABLE.  PT STATED HE'S GOOD ON A MON OR TUES AND CALL BACK Monday GOOD BYE

## 2018-03-09 ENCOUNTER — Other Ambulatory Visit (HOSPITAL_COMMUNITY): Payer: Self-pay | Admitting: Nurse Practitioner

## 2018-03-10 NOTE — Telephone Encounter (Signed)
I s/w pt today who is agreeable to appt for surgery clearance. Pt has been scheduled to see [name], PA at the [facility] office due to no further openings at [facility] is grateful for the appt. Pt surgery I scheduled for 03/18/18 , not 5/20 which was entered on surgery clearance information. I will forward information to [name], [place] so that he may have information for appt tomorrow.

## 2018-03-10 NOTE — Telephone Encounter (Signed)
Eliquis 5mg  refill request received; pt is 72 yrs old, wt-80.6kg, Crea-0.70 on 01/20/18 at [facility], last seen by Dr. Radford Pax on 04/22/17; will send in refill to requested pharmacy.

## 2018-03-11 ENCOUNTER — Ambulatory Visit: Payer: PPO | Admitting: Cardiology

## 2018-03-11 ENCOUNTER — Encounter: Payer: Self-pay | Admitting: Cardiology

## 2018-03-11 VITALS — BP 140/88 | HR 72 | Ht 68.0 in | Wt 169.6 lb

## 2018-03-11 DIAGNOSIS — Z7901 Long term (current) use of anticoagulants: Secondary | ICD-10-CM

## 2018-03-11 DIAGNOSIS — I10 Essential (primary) hypertension: Secondary | ICD-10-CM | POA: Diagnosis not present

## 2018-03-11 DIAGNOSIS — I482 Chronic atrial fibrillation, unspecified: Secondary | ICD-10-CM

## 2018-03-11 DIAGNOSIS — Z01810 Encounter for preprocedural cardiovascular examination: Secondary | ICD-10-CM | POA: Diagnosis not present

## 2018-03-11 NOTE — Patient Instructions (Signed)
Medication Instructions:  Your physician recommends that you continue on your current medications as directed. Please refer to the Current Medication list given to you today.  Labwork: NONE   Testing/Procedures: NONE   Follow-Up: FOLLOW UP WITH DR TURNER AS NEEDED   Any Other Special Instructions Will Be Listed Below (If Applicable). RECORD BLOOD PRESSURE READINGS, CONTACT PCP IS TOP NUMBER IS GREATER THAN 135 If you need a refill on your cardiac medications before your next appointment, please call your pharmacy.

## 2018-03-11 NOTE — Telephone Encounter (Signed)
I saw the patient today in the office for pre op clearance. Pharmacy recommends holding Eliquis 24 hrs pre op, resume post op per Dr Junious Silk. I suggested he take his Toprol the am if surgery with a sip of water. He is otherwise cleared from a cardiac standpoint for surgery without further cardiac testing.  [name] PA-C 03/11/2018 9:52 AM

## 2018-03-11 NOTE — Progress Notes (Signed)
03/11/2018 Scott Newton. [name]   12/06/1945  [id number]  Primary Physician London Pepper, MD Primary Cardiologist: Dr Radford Pax  HPI:  Pleasant 72 y/o male with a history of CAF on Eliquis. Echo in 2016 showed normal LVF. He has no history of CAD or MI. He is in the office today for pre operative clearance prior to urologic surgery. The pt tells me he and his wife have recently returned from vacation in [place]. He says they did quite a bit of walking and he noted no unusual chest pain or SOB. He says he is hardly aware of his AF.    Current Outpatient Medications  Medication Sig Dispense Refill  . ELIQUIS 5 MG TABS tablet TAKE 1 TABLET BY MOUTH TWICE A DAY 180 tablet 1  . metoprolol succinate (TOPROL-XL) 50 MG 24 hr tablet TAKE 1 1/2 TABLET BY MOUTH DAILY WITH OR IMMEDIATELY FOLLOWING A MEAL. 135 tablet 6  . Multiple Vitamins-Minerals (MULTIVITAMIN WITH MINERALS) tablet Take 1 tablet by mouth daily.     No current facility-administered medications for this visit.     No Known Allergies  Past Medical History:  Diagnosis Date  . Chronic atrial fibrillation ([place]) 08/2015   s/p DCCV with reversion back to afib now pursuing rate control.  . ED (erectile dysfunction)   . Essential hypertension   . Umbilical hernia     Social History   Socioeconomic History  . Marital status: Married    Spouse name: Not on file  . Number of children: 3  . Years of education: college  . Highest education level: Not on file  Occupational History  . Occupation: unemployed  Social Needs  . Financial resource strain: Not on file  . Food insecurity:    Worry: Not on file    Inability: Not on file  . Transportation needs:    Medical: Not on file    Non-medical: Not on file  Tobacco Use  . Smoking status: Current Some Day Smoker    Types: Cigars  . Smokeless tobacco: Never Used  . Tobacco comment: Used to smoke cigarettes but quit, now smokes occasional cigar  Substance and Sexual Activity  .  Alcohol use: Yes    Alcohol/week: 0.0 oz    Comment: 2-3 glasses of wine several days a week  . Drug use: No  . Sexual activity: Not on file  Lifestyle  . Physical activity:    Days per week: Not on file    Minutes per session: Not on file  . Stress: Not on file  Relationships  . Social connections:    Talks on phone: Not on file    Gets together: Not on file    Attends religious service: Not on file    Active member of club or organization: Not on file    Attends meetings of clubs or organizations: Not on file    Relationship status: Not on file  . Intimate partner violence:    Fear of current or ex partner: Not on file    Emotionally abused: Not on file    Physically abused: Not on file    Forced sexual activity: Not on file  Other Topics Concern  . Not on file  Social History Narrative  . Not on file     Family History  Problem Relation Age of Onset  . Heart disease Father 67  . Heart attack Father 30  . Hypertension Mother 26  . Heart attack Brother 33  . Stroke  Neg Hx      Review of Systems: General: negative for chills, fever, night sweats or weight changes.  Cardiovascular: negative for chest pain, dyspnea on exertion, edema, orthopnea, palpitations, paroxysmal nocturnal dyspnea or shortness of breath Dermatological: negative for rash Respiratory: negative for cough or wheezing Urologic: negative for hematuria Abdominal: negative for nausea, vomiting, diarrhea, bright red blood per rectum, melena, or hematemesis Neurologic: negative for visual changes, syncope, or dizziness All other systems reviewed and are otherwise negative except as noted above.    Blood pressure 140/88, pulse 72, height 5\' 8"  (1.727 m), weight 169 lb 9.6 oz (76.9 kg).  General appearance: alert, cooperative and no distress Neck: no carotid bruit and no JVD Lungs: clear to auscultation bilaterally Heart: irregularly irregular rhythm Abdomen: soft, non-tender; bowel sounds normal; no  masses,  no organomegaly and umbilcal hernia noted Extremities: extremities normal, atraumatic, no cyanosis or edema Pulses: 2+ and symmetric Skin: Skin color, texture, turgor normal. No rashes or lesions Neurologic: Grossly normal  EKG AF with VR 72  ASSESSMENT AND PLAN:   Pre-operative cardiovascular examination Seen today prior to urologic procedure  Chronic atrial fibrillation (HCC) Asymptomatic, rate control  Essential hypertension His B/P is borderline. repeat by me- 132/84. I suggested he follow his B/P at for a few weeks and then discuss the results with his PCP.   Chronic anticoagulation CHADS VASC=2, on chronic Eliquis. I discussed anticoagulation with our pharmacist today. Our recommendations are that holding Eliquis 24 hours prior to his procedure is adequate. Resume Eliquis post procedure per Dr Junious Silk.    PLAN  Pt is cleared from a cardiac standpoint for scheduled procedure. Our recommendations are that holding Eliquis 24 hours pre op is adequate. I suggested he take his Toprol the morning of with small sip of water.  The pt will track his B/P at home and f/u this up with his PCP. F/U with Dr Radford Pax to be scheduled.   [name] PA-C 03/11/2018 9:46 AM

## 2018-03-11 NOTE — Assessment & Plan Note (Signed)
CHADS VASC=2, on chronic Eliquis. I discussed anticoagulation with our pharmacist today. Our recommendations are that holding Eliquis 24 hours prior to his procedure is adequate. Resume Eliquis post procedure per Dr Junious Silk.

## 2018-03-11 NOTE — Assessment & Plan Note (Signed)
His B/P is borderline. repeat by me- 132/84. I suggested he follow his B/P at for a few weeks and then discuss the results with his PCP.

## 2018-03-11 NOTE — Assessment & Plan Note (Signed)
Seen today prior to urologic procedure

## 2018-03-11 NOTE — Assessment & Plan Note (Signed)
Asymptomatic, rate control

## 2018-03-13 ENCOUNTER — Encounter (HOSPITAL_BASED_OUTPATIENT_CLINIC_OR_DEPARTMENT_OTHER): Payer: Self-pay | Admitting: *Deleted

## 2018-03-13 ENCOUNTER — Other Ambulatory Visit: Payer: Self-pay

## 2018-03-13 NOTE — Progress Notes (Signed)
SPOKE W/ PT VIA PHONE FOR PRE-OP INTERVIEW.  NPO AFTER MN.  ARRIVE AT 0900.  NEEDS ISTAT.  CURRENT EKG IN CHART AND Epic.  WILL TAKE METOPROLOL AM DOS W/ SIPS OF WATER.  PT CARDIOLOIGST NOTE AND CLEARANCE IN CHART AND Epic, DATED 03-11-2018.  PT STATED AWARE TO STOP ELIQUIS 24 HOURS PRIOR TO SURGERY.

## 2018-03-14 ENCOUNTER — Encounter: Payer: Self-pay | Admitting: Cardiology

## 2018-03-18 ENCOUNTER — Encounter (HOSPITAL_BASED_OUTPATIENT_CLINIC_OR_DEPARTMENT_OTHER): Payer: Self-pay

## 2018-03-18 ENCOUNTER — Ambulatory Visit (HOSPITAL_BASED_OUTPATIENT_CLINIC_OR_DEPARTMENT_OTHER): Payer: PPO | Admitting: Anesthesiology

## 2018-03-18 ENCOUNTER — Ambulatory Visit (HOSPITAL_BASED_OUTPATIENT_CLINIC_OR_DEPARTMENT_OTHER)
Admission: RE | Admit: 2018-03-18 | Discharge: 2018-03-18 | Disposition: A | Discharge status: Home / Self Care | Payer: PPO | Source: Ambulatory Visit | Attending: Urology | Admitting: Urology

## 2018-03-18 ENCOUNTER — Encounter (HOSPITAL_BASED_OUTPATIENT_CLINIC_OR_DEPARTMENT_OTHER)
Admission: RE | Disposition: A | Discharge status: Home / Self Care | Payer: Self-pay | Source: Ambulatory Visit | Attending: Urology

## 2018-03-18 DIAGNOSIS — Z79899 Other long term (current) drug therapy: Secondary | ICD-10-CM | POA: Insufficient documentation

## 2018-03-18 DIAGNOSIS — N486 Induration penis plastica: Secondary | ICD-10-CM | POA: Insufficient documentation

## 2018-03-18 DIAGNOSIS — N323 Diverticulum of bladder: Secondary | ICD-10-CM | POA: Insufficient documentation

## 2018-03-18 DIAGNOSIS — Z7901 Long term (current) use of anticoagulants: Secondary | ICD-10-CM | POA: Insufficient documentation

## 2018-03-18 DIAGNOSIS — F17210 Nicotine dependence, cigarettes, uncomplicated: Secondary | ICD-10-CM | POA: Diagnosis not present

## 2018-03-18 DIAGNOSIS — N401 Enlarged prostate with lower urinary tract symptoms: Secondary | ICD-10-CM | POA: Insufficient documentation

## 2018-03-18 DIAGNOSIS — L538 Other specified erythematous conditions: Secondary | ICD-10-CM | POA: Insufficient documentation

## 2018-03-18 DIAGNOSIS — N400 Enlarged prostate without lower urinary tract symptoms: Secondary | ICD-10-CM | POA: Diagnosis not present

## 2018-03-18 DIAGNOSIS — I10 Essential (primary) hypertension: Secondary | ICD-10-CM | POA: Insufficient documentation

## 2018-03-18 DIAGNOSIS — R310 Gross hematuria: Secondary | ICD-10-CM | POA: Diagnosis not present

## 2018-03-18 DIAGNOSIS — R3912 Poor urinary stream: Secondary | ICD-10-CM | POA: Diagnosis not present

## 2018-03-18 DIAGNOSIS — N138 Other obstructive and reflux uropathy: Secondary | ICD-10-CM

## 2018-03-18 DIAGNOSIS — F17290 Nicotine dependence, other tobacco product, uncomplicated: Secondary | ICD-10-CM | POA: Insufficient documentation

## 2018-03-18 DIAGNOSIS — Z8249 Family history of ischemic heart disease and other diseases of the circulatory system: Secondary | ICD-10-CM | POA: Diagnosis not present

## 2018-03-18 DIAGNOSIS — I482 Chronic atrial fibrillation: Secondary | ICD-10-CM | POA: Insufficient documentation

## 2018-03-18 DIAGNOSIS — N3289 Other specified disorders of bladder: Secondary | ICD-10-CM | POA: Diagnosis not present

## 2018-03-18 DIAGNOSIS — N3020 Other chronic cystitis without hematuria: Secondary | ICD-10-CM | POA: Diagnosis not present

## 2018-03-18 HISTORY — DX: Benign prostatic hyperplasia with lower urinary tract symptoms: N40.1

## 2018-03-18 HISTORY — DX: Long term (current) use of anticoagulants: Z79.01

## 2018-03-18 HISTORY — DX: Complete loss of teeth, unspecified cause, unspecified class: K08.109

## 2018-03-18 HISTORY — DX: Other obstructive and reflux uropathy: N13.8

## 2018-03-18 HISTORY — DX: Gross hematuria: R31.0

## 2018-03-18 HISTORY — DX: Complete loss of teeth, unspecified cause, unspecified class: Z97.2

## 2018-03-18 HISTORY — DX: Presence of spectacles and contact lenses: Z97.3

## 2018-03-18 HISTORY — PX: CYSTOSCOPY WITH INSERTION OF UROLIFT: SHX6678

## 2018-03-18 HISTORY — PX: CYSTOSCOPY WITH FULGERATION: SHX6638

## 2018-03-18 HISTORY — DX: Poor urinary stream: R39.12

## 2018-03-18 HISTORY — DX: Induration penis plastica: N48.6

## 2018-03-18 LAB — POCT I-STAT 4, (NA,K, GLUC, HGB,HCT)
GLUCOSE: 94 mg/dL (ref 65–99)
HCT: 45 % (ref 39.0–52.0)
Hemoglobin: 15.3 g/dL (ref 13.0–17.0)
POTASSIUM: 4.3 mmol/L (ref 3.5–5.1)
Sodium: 142 mmol/L (ref 135–145)

## 2018-03-18 SURGERY — CYSTOSCOPY, WITH BLADDER FULGURATION
Anesthesia: General | Site: Bladder

## 2018-03-18 MED ORDER — PROMETHAZINE HCL 25 MG/ML IJ SOLN
6.2500 mg | INTRAMUSCULAR | Status: DC | PRN
Start: 2018-03-18 — End: 2018-03-18
  Filled 2018-03-18: qty 1

## 2018-03-18 MED ORDER — LIDOCAINE 2% (20 MG/ML) 5 ML SYRINGE
INTRAMUSCULAR | Status: AC
  Filled 2018-03-18: qty 5

## 2018-03-18 MED ORDER — DEXAMETHASONE SODIUM PHOSPHATE 10 MG/ML IJ SOLN
INTRAMUSCULAR | Status: AC
  Filled 2018-03-18: qty 1

## 2018-03-18 MED ORDER — TRAMADOL HCL 50 MG PO TABS: 50.0000 mg | ORAL_TABLET | Freq: Four times a day (QID) | ORAL | 0 refills | Status: DC | PRN

## 2018-03-18 MED ORDER — CEFAZOLIN SODIUM-DEXTROSE 2-4 GM/100ML-% IV SOLN
2.0000 g | Freq: Once | INTRAVENOUS | Status: AC
  Administered 2018-03-18: 2 g via INTRAVENOUS
  Filled 2018-03-18: qty 100

## 2018-03-18 MED ORDER — CEFAZOLIN SODIUM-DEXTROSE 2-4 GM/100ML-% IV SOLN
INTRAVENOUS | Status: AC
  Filled 2018-03-18: qty 100

## 2018-03-18 MED ORDER — PROPOFOL 10 MG/ML IV BOLUS
INTRAVENOUS | Status: AC
  Filled 2018-03-18: qty 40

## 2018-03-18 MED ORDER — FENTANYL CITRATE (PF) 100 MCG/2ML IJ SOLN
INTRAMUSCULAR | Status: AC
  Filled 2018-03-18: qty 2

## 2018-03-18 MED ORDER — ONDANSETRON HCL 4 MG/2ML IJ SOLN
INTRAMUSCULAR | Status: AC
  Filled 2018-03-18: qty 2

## 2018-03-18 MED ORDER — FENTANYL CITRATE (PF) 100 MCG/2ML IJ SOLN
INTRAMUSCULAR | Status: DC | PRN
  Administered 2018-03-18: 100 ug via INTRAVENOUS

## 2018-03-18 MED ORDER — CEPHALEXIN 500 MG PO CAPS: 500.0000 mg | ORAL_CAPSULE | Freq: Every day | ORAL | 0 refills | Status: AC

## 2018-03-18 MED ORDER — OXYCODONE HCL 5 MG PO TABS
5.0000 mg | ORAL_TABLET | Freq: Once | ORAL | Status: DC | PRN
  Filled 2018-03-18: qty 1

## 2018-03-18 MED ORDER — ONDANSETRON HCL 4 MG/2ML IJ SOLN
INTRAMUSCULAR | Status: DC | PRN
  Administered 2018-03-18: 4 mg via INTRAVENOUS

## 2018-03-18 MED ORDER — STERILE WATER FOR IRRIGATION IR SOLN
Status: DC | PRN
  Administered 2018-03-18: 500 mL
  Administered 2018-03-18 (×3): 3000 mL

## 2018-03-18 MED ORDER — MIDAZOLAM HCL 5 MG/5ML IJ SOLN
INTRAMUSCULAR | Status: DC | PRN
  Administered 2018-03-18: 2 mg via INTRAVENOUS

## 2018-03-18 MED ORDER — OXYCODONE HCL 5 MG/5ML PO SOLN
5.0000 mg | Freq: Once | ORAL | Status: DC | PRN
  Filled 2018-03-18: qty 5

## 2018-03-18 MED ORDER — LIDOCAINE HCL (CARDIAC) PF 100 MG/5ML IV SOSY
PREFILLED_SYRINGE | INTRAVENOUS | Status: DC | PRN
  Administered 2018-03-18: 50 mg via INTRAVENOUS

## 2018-03-18 MED ORDER — HYDROMORPHONE HCL 1 MG/ML IJ SOLN
0.2500 mg | INTRAMUSCULAR | Status: DC | PRN
  Filled 2018-03-18: qty 0.5

## 2018-03-18 MED ORDER — PROPOFOL 10 MG/ML IV BOLUS
INTRAVENOUS | Status: DC | PRN
  Administered 2018-03-18: 200 mg via INTRAVENOUS

## 2018-03-18 MED ORDER — LACTATED RINGERS IV SOLN
INTRAVENOUS | Status: DC
  Administered 2018-03-18: 1000 mL via INTRAVENOUS
  Filled 2018-03-18: qty 1000

## 2018-03-18 MED ORDER — MIDAZOLAM HCL 2 MG/2ML IJ SOLN
INTRAMUSCULAR | Status: AC
  Filled 2018-03-18: qty 2

## 2018-03-18 MED ORDER — DEXAMETHASONE SODIUM PHOSPHATE 10 MG/ML IJ SOLN
INTRAMUSCULAR | Status: DC | PRN
  Administered 2018-03-18: 4 mg via INTRAVENOUS

## 2018-03-18 MED ORDER — APIXABAN 5 MG PO TABS: 5.0000 mg | ORAL_TABLET | Freq: Two times a day (BID) | ORAL | 1 refills | Status: DC

## 2018-03-18 SURGICAL SUPPLY — 23 items
0168L18 ×3 IMPLANT
BAG DRAIN URO-CYSTO SKYTR STRL (DRAIN) ×3 IMPLANT
BAG URINE DRAINAGE (UROLOGICAL SUPPLIES) ×3 IMPLANT
CATH COUDE FOLEY 2W 5CC 18FR (CATHETERS) ×2 IMPLANT
CLOTH BEACON ORANGE TIMEOUT ST (SAFETY) ×3 IMPLANT
ELECT REM PT RETURN 9FT ADLT (ELECTROSURGICAL) ×3
ELECTRODE REM PT RTRN 9FT ADLT (ELECTROSURGICAL) ×1 IMPLANT
GLOVE BIO SURGEON STRL SZ 6.5 (GLOVE) ×2 IMPLANT
GLOVE BIO SURGEON STRL SZ7 (GLOVE) ×3 IMPLANT
GLOVE BIO SURGEON STRL SZ7.5 (GLOVE) ×6 IMPLANT
GLOVE BIO SURGEONS STRL SZ 6.5 (GLOVE) ×1
GOWN STRL REUS W/ TWL XL LVL3 (GOWN DISPOSABLE) ×1 IMPLANT
GOWN STRL REUS W/TWL LRG LVL3 (GOWN DISPOSABLE) ×6 IMPLANT
GOWN STRL REUS W/TWL XL LVL3 (GOWN DISPOSABLE) ×2
KIT TURNOVER CYSTO (KITS) ×3 IMPLANT
MANIFOLD NEPTUNE II (INSTRUMENTS) ×3 IMPLANT
NEEDLE HYPO 22GX1.5 SAFETY (NEEDLE) IMPLANT
NS IRRIG 500ML POUR BTL (IV SOLUTION) IMPLANT
PACK CYSTO (CUSTOM PROCEDURE TRAY) ×3 IMPLANT
SYSTEM UROLIFT (Male Continence) ×15 IMPLANT
TUBE CONNECTING 12'X1/4 (SUCTIONS) ×1
TUBE CONNECTING 12X1/4 (SUCTIONS) ×2 IMPLANT
WATER STERILE IRR 3000ML UROMA (IV SOLUTION) ×3 IMPLANT

## 2018-03-18 NOTE — Anesthesia Preprocedure Evaluation (Signed)
Anesthesia Evaluation  Patient identified by MRN, date of birth, ID band Patient awake    Reviewed: Allergy & Precautions, NPO status , Patient's Chart, lab work & pertinent test results, reviewed documented beta blocker date and time   Airway Mallampati: III  TM Distance: >3 FB Neck ROM: Full    Dental  (+) Upper Dentures, Partial Lower   Pulmonary Current Smoker,    Pulmonary exam normal breath sounds clear to auscultation       Cardiovascular hypertension, Pt. on home beta blockers Normal cardiovascular exam+ dysrhythmias Atrial Fibrillation  Rhythm:Regular Rate:Normal  ECG: A-fib, rate 72  Sees cardiologist  ECHO: LV EF: 60% -   65%   Neuro/Psych negative neurological ROS  negative psych ROS   GI/Hepatic negative GI ROS, Neg liver ROS,   Endo/Other  negative endocrine ROS  Renal/GU negative Renal ROS     Musculoskeletal negative musculoskeletal ROS (+)   Abdominal   Peds  Hematology negative hematology ROS (+)   Anesthesia Other Findings GROSS HEMATURIA BENIGN PROSTATIC HYPERPLASIA WEAK URINARY STREAM  Reproductive/Obstetrics                             Anesthesia Physical Anesthesia Plan  ASA: III  Anesthesia Plan: General   Post-op Pain Management:    Induction: Intravenous  PONV Risk Score and Plan: 1 and Ondansetron, Dexamethasone and Treatment may vary due to age or medical condition  Airway Management Planned: LMA  Additional Equipment:   Intra-op Plan:   Post-operative Plan: Extubation in OR  Informed Consent: I have reviewed the patients History and Physical, chart, labs and discussed the procedure including the risks, benefits and alternatives for the proposed anesthesia with the patient or authorized representative who has indicated his/her understanding and acceptance.   Dental advisory given  Plan Discussed with: CRNA  Anesthesia Plan Comments:          Anesthesia Quick Evaluation

## 2018-03-18 NOTE — H&P (Signed)
H&P  Chief Complaint: BPH, weak stream, gross hematuria, bladder erythema, bladder diverticulum   History of Present Illness: 72 yo WM with h/o BPH, weak stream, gross hematuria and a diverticulum with bleeding erythema noted on cystoscopy. Also, obstructing lateral lobes noted. He was brought for cystoscopy, bladder biopsy and fulguration and UroLift. He is well without cough, congestion, fever or dysuria.   Past Medical History:  Diagnosis Date  . Anticoagulant long-term use    eliquis  . BPH with obstruction/lower urinary tract symptoms   . Chronic atrial fibrillation [facility]) cardiologist-  dr Tressia Miners turner   dx 10/ 2016---  s/p DCCV with reversion back to afib now pursuing rate control. 10-19-2015  . ED (erectile dysfunction)   . Essential hypertension   . Full dentures   . Gross hematuria   . Peyronie's disease   . Umbilical hernia   . Weak urinary stream   . Wears glasses    Past Surgical History:  Procedure Laterality Date  . CARDIOVERSION N/A 10/19/2015   Procedure: CARDIOVERSION;  Surgeon: Josue Hector, MD;  Location: [facility];  Service: Cardiovascular;  Laterality: N/A;  . KNEE ARTHROSCOPY Left 2009  approx.  . TONSILLECTOMY  child  . TRANSTHORACIC ECHOCARDIOGRAM  09-27-2015  dr Tressia Miners turner   mild focal basal hypertrophy of the septum,  ef 60-65%/  trivial AR/  mild MR/  severe LAE/      Home Medications:  Medications Prior to Admission  Medication Sig Dispense Refill Last Dose  . ELIQUIS 5 MG TABS tablet TAKE 1 TABLET BY MOUTH TWICE A DAY 180 tablet 1 03/16/2018  . metoprolol succinate (TOPROL-XL) 50 MG 24 hr tablet TAKE 1 1/2 TABLET BY MOUTH DAILY WITH OR IMMEDIATELY FOLLOWING A MEAL. (Patient taking differently: Take 75 mg by mouth every morning. TAKE 1 1/2 TABLET BY MOUTH DAILY WITH OR IMMEDIATELY FOLLOWING A MEAL.) 135 tablet 6 03/18/2018 at 0700  . Multiple Vitamins-Minerals (MULTIVITAMIN WITH MINERALS) tablet Take 1 tablet by mouth daily.   Past Week at Unknown  time   Allergies: No Known Allergies  Family History  Problem Relation Age of Onset  . Heart disease Father 3  . Heart attack Father 63  . Hypertension Mother 27  . Heart attack Brother 89  . Stroke Neg Hx    Social History:  reports that he has been smoking cigars and cigarettes.  He has smoked for the past 20.00 years. He has never used smokeless tobacco. He reports that he drinks about 8.4 oz of alcohol per week. He reports that he does not use drugs.  ROS: A complete review of systems was performed.  All systems are negative except for pertinent findings as noted. Review of Systems  All other systems reviewed and are negative.    Physical Exam:  Vital signs in last 24 hours: Temp:  [97.7 F (36.5 C)] 97.7 F (36.5 C) (04/30 0856) Pulse Rate:  [78] 78 (04/30 0856) Resp:  [16] 16 (04/30 0856) BP: (142)/(86) 142/86 (04/30 0856) SpO2:  [100 %] 100 % (04/30 0856) Weight:  [76.9 kg (169 lb 8 oz)] 76.9 kg (169 lb 8 oz) (04/30 0856) General:  Alert and oriented, No acute distress HEENT: Normocephalic, atraumatic Cardiovascular: Regular rate and rhythm Lungs: Regular rate and effort Abdomen: Soft, nontender, nondistended, no abdominal masses Extremities: No edema Neurologic: Grossly intact  Laboratory Data:  Results for orders placed or performed during the hospital encounter of 03/18/18 (from the past 24 hour(s))  I-STAT 4, (NA,K, GLUC,  HGB,HCT)     Status: None   Collection Time: 03/18/18  9:58 AM  Result Value Ref Range   Sodium 142 135 - 145 mmol/L   Potassium 4.3 3.5 - 5.1 mmol/L   Glucose, Bld 94 65 - 99 mg/dL   HCT 45.0 39.0 - 52.0 %   Hemoglobin 15.3 13.0 - 17.0 g/dL   No results found for this or any previous visit (from the past 240 hour(s)). Creatinine: No results for input(s): CREATININE in the last 168 hours.  Impression/Assessment:  BPH, weak stream, gross hematuria, bladder erythema, bladder diverticulum   Plan:  I discussed with the patient the  nature, potential benefits, risks and alternatives to cystocopy, bladder biopsy, fulguration, UroLift prostatic urethral lift, including side effects of the proposed treatment, the likelihood of the patient achieving the goals of the procedure, and any potential problems that might occur during the procedure or recuperation. Discussed post-op foley catheter drainage. All questions answered. Patient elects to proceed.    Festus Aloe 03/18/2018, 10:11 AM

## 2018-03-18 NOTE — Anesthesia Procedure Notes (Signed)
Procedure Name: LMA Insertion Date/Time: 03/18/2018 11:29 AM Performed by: Jonna Munro, CRNA Pre-anesthesia Checklist: Patient identified, Emergency Drugs available, Suction available, Patient being monitored and Timeout performed Patient Re-evaluated:Patient Re-evaluated prior to induction Oxygen Delivery Method: Circle system utilized Preoxygenation: Pre-oxygenation with 100% oxygen Induction Type: IV induction LMA: LMA inserted LMA Size: 5.0 Number of attempts: 1 Placement Confirmation: positive ETCO2 and breath sounds checked- equal and bilateral Tube secured with: Tape Dental Injury: Teeth and Oropharynx as per pre-operative assessment

## 2018-03-18 NOTE — Op Note (Signed)
Preoperative diagnosis: BPH, weak stream, bladder diverticulum, bladder erythema, gross hematuria Postoperative diagnosis: Same  Procedure: Cystoscopy with bladder biopsy and fulguration 0.5 to 2 cm; prostatic urethral lift (UroLift)  Surgeon: Junious Silk  Anesthesia: General  Indication for procedure: 72 year old with BPH and weak stream.  He was noted to have obstructive changes on hematuria evaluation with cystoscopy revealing obstructing lateral lobes, bladder trabeculation, cellules and diverticulum.  He also had gross hematuria in the left bladder diverticulum had bleeding erythematous mucosa.  Findings: On cystoscopy the prostatic urethra was obstructed from lateral lobe hypertrophy, although there was a bit of a high bladder neck.  The bladder was trabeculated.  Mild erythema posteriorly, left bladder diverticulum with erythematous mucosal on the wall.  Trigone and ureteral orifice ease appeared normal with clear eflux.  No stone or foreign body in the bladder.  Description of procedure: After consent was obtained the cystoscope was passed per urethra and the bladder and the left bladder diverticulum carefully inspected with the 30 degree and 70 degree lens.  I then passed the flexible biopsy forceps and biopsies some mild erythema posteriorly of the bladder and then biopsied the left diverticulum.  Posterior biopsy was fulgurated with the Bugbee electrode as well as the left diverticulum.  The diverticulum was right on the obturator nerve therefore I had to pulse the coagulation for just very brief periods.  However I was happy with the fulguration and eradication of any abnormal appearing tissue.  Hemostasis was excellent low pressure.  I then turned my attention to the prostate. A 12F cystoscope was inserted into the bladder. The cystoscopy bridge was replaced with a UroLift delivery device.The first treatment site was the patient's left side approximately 1.5cm distal to the bladder neck (1).  The distal tip of the delivery device was then angled laterally approximately 20 degrees at this position to compress the lateral lobe. The trigger was pulled, thereby deploying a needle containing the implant through the prostate. The needle was then retracted, allowing one end of the implant to be delivered to the capsular surface of the prostate. The implant was then tensioned to assure capsular seating and removal of slack monofilament. The device was then angled back toward midline and slowly advanced proximally until cystoscopic verification of the monofilament being centered in the delivery bay. The urethral end piece was then affixed to the monofilament thereby tailoring the size of the implant. Excess filament was then severed. The delivery device was then re-advanced into the bladder. The delivery device was then replaced with cystoscope and bridge and the implant location and opening effect was confirmed cystoscopically. The same procedure was then repeated on the right side (2).  The same procedure was then repeated at the left apical (3) and the right apical (4).  Inspection noted there to be a good anterior channel, but the left implants ended up being close together with #3 more in the mid prostatic urethra and the first (#1) just a few millimeters from the bladder neck.  Therefore I removed the first implant (#1) and then came back to the veru and placed another one just proximal to the veru on the left (#5) using the same technique. A fiinal cystoscopy was conducted first to inspect the location and state of each implant and second, to confirm the presence of a continuous anterior channel was present through the prostatic urethra with irrigation flow turned off. 5 Implants were delivered in total with 4 implanted and one removed. Following this, the scope was removed  and an 66 French Foley catheter was placed and hooked to dependent drainage. He was then awakened and taken to the recovery area in  stable condition. He tolerated the procedure well.  Complications: None  Blood loss: Minimal  Specimens to pathology: #1 posterior bladder biopsy #2 left diverticulum biopsy  Drains: 18 French coud catheter  Disposition: Patient stable to PACU

## 2018-03-18 NOTE — Discharge Instructions (Signed)
Indwelling Urinary Catheter Care, Adult Take good care of your catheter to keep it working and to prevent problems. How to wear your catheter Attach your catheter to your leg with tape (adhesive tape) or a leg strap. Make sure it is not too tight. If you use tape, remove any bits of tape that are already on the catheter. How to wear a drainage bag You should have:  A large overnight bag.  A small leg bag.  Overnight Bag You may wear the overnight bag at any time. Always keep the bag below the level of your bladder but off the floor. When you sleep, put a clean plastic bag in a wastebasket. Then hang the bag inside the wastebasket. Leg Bag Never wear the leg bag at night. Always wear the leg bag below your knee. Keep the leg bag secure with a leg strap or tape. How to care for your skin  Clean the skin around the catheter at least once every day.  Shower every day. Do not take baths.  Put creams, lotions, or ointments on your genital area only as told by your doctor.  Do not use powders, sprays, or lotions on your genital area. How to clean your catheter and your skin 1. Wash your hands with soap and water. 2. Wet a washcloth in warm water and gentle (mild) soap. 3. Use the washcloth to clean the skin where the catheter enters your body. Clean downward and wipe away from the catheter in small circles. Do not wipe toward the catheter. 4. Pat the area dry with a clean towel. Make sure to clean off all soap. How to care for your drainage bags Empty your drainage bag when it is ?- full or at least 2-3 times a day. Replace your drainage bag once a month or sooner if it starts to smell bad or look dirty. Do not clean your drainage bag unless told by your doctor. Emptying a drainage bag  Supplies Needed  Rubbing alcohol.  Gauze pad or cotton ball.  Tape or a leg strap.  Steps 1. Wash your hands with soap and water. 2. Separate (detach) the bag from your leg. 3. Hold the bag over  the toilet or a clean container. Keep the bag below your hips and bladder. This stops pee (urine) from going back into the tube. 4. Open the pour spout at the bottom of the bag. 5. Empty the pee into the toilet or container. Do not let the pour spout touch any surface. 6. Put rubbing alcohol on a gauze pad or cotton ball. 7. Use the gauze pad or cotton ball to clean the pour spout. 8. Close the pour spout. 9. Attach the bag to your leg with tape or a leg strap. 10. Wash your hands.  Changing a drainage bag Supplies Needed  Alcohol wipes.  A clean drainage bag.  Adhesive tape or a leg strap.  Steps 1. Wash your hands with soap and water. 2. Separate the dirty bag from your leg. 3. Pinch the rubber catheter with your fingers so that pee does not spill out. 4. Separate the catheter tube from the drainage tube where these tubes connect (at the connection valve). Do not let the tubes touch any surface. 5. Clean the end of the catheter tube with an alcohol wipe. Use a different alcohol wipe to clean the end of the drainage tube. 6. Connect the catheter tube to the drainage tube of the clean bag. 7. Attach the new bag to  the leg with adhesive tape or a leg strap. 8. Wash your hands.  How to prevent infection and other problems  Never pull on your catheter or try to remove it. Pulling can damage tissue in your body.  Always wash your hands before and after touching your catheter.  If a leg strap gets wet, replace it with a dry one.  Drink enough fluids to keep your pee clear or pale yellow, or as told by your doctor.  Do not let the drainage bag or tubing touch the floor.  Wear cotton underwear.  If you are male, wipe from front to back after you poop (have a bowel movement).  Check on the catheter often to make sure it works and the tubing is not twisted. Get help if:  Your pee is cloudy.  Your pee smells unusually bad.  Your pee is not draining into the bag.  Your  tube gets clogged.  Your catheter starts to leak.  Your bladder feels full. Get help right away if:  You have redness, swelling, or pain where the catheter enters your body.  You have fluid, pus, or a bad smell coming from the area where the catheter enters your body.  The area where the catheter enters your body feels warm.  You have a fever.  You have pain in your: ? Stomach (abdomen). ? Legs. ? Lower back. ? Bladder.  You see blood fill the catheter.  Your pee is pink or red.  You feel sick to your stomach (nauseous).  You throw up (vomit).  You have chills.  Your catheter gets pulled out. This information is not intended to replace advice given to you by your health care provider. Make sure you discuss any questions you have with your health care provider. Document Released: 03/02/2013 Document Revised: 10/03/2016 Document Reviewed: 04/20/2014 Elsevier Interactive Patient Education  2018 [employer].   Cystoscopy, Care After Refer to this sheet in the next few weeks. These instructions provide you with information about caring for yourself after your procedure. Your health care provider may also give you more specific instructions. Your treatment has been planned according to current medical practices, but problems sometimes occur. Call your health care provider if you have any problems or questions after your procedure. What can I expect after the procedure? After the procedure, it is common to have:  Mild pain when you urinate. Pain should stop within a few minutes after you urinate. This may last for up to 1 week.  A small amount of blood in your urine for several days.  Feeling like you need to urinate but producing only a small amount of urine.  Follow these instructions at home:  Medicines  Take over-the-counter and prescription medicines only as told by your health care provider.  If you were prescribed an antibiotic medicine, take it as told by your  health care provider. Do not stop taking the antibiotic even if you start to feel better. General instructions   Return to your normal activities as told by your health care provider. Ask your health care provider what activities are safe for you.  Do not drive for 24 hours if you received a sedative.  Watch for any blood in your urine. If the amount of blood in your urine increases, call your health care provider.  Follow instructions from your health care provider about eating or drinking restrictions.  If a tissue sample was removed for testing (biopsy) during your procedure, it is your responsibility  to get your test results. Ask your health care provider or the department performing the test when your results will be ready.  Drink enough fluid to keep your urine clear or pale yellow.  Keep all follow-up visits as told by your health care provider. This is important. Contact a health care provider if:  You have pain that gets worse or does not get better with medicine, especially pain when you urinate.  You have difficulty urinating. Get help right away if:  You have more blood in your urine.  You have blood clots in your urine.  You have abdominal pain.  You have a fever or chills.  You are unable to urinate. This information is not intended to replace advice given to you by your health care provider. Make sure you discuss any questions you have with your health care provider. Document Released: 05/25/2005 Document Revised: 04/12/2016 Document Reviewed: 09/22/2015 Elsevier Interactive Patient Education  2018 [employer].   Prostatic Urethral Lift, Care After This sheet gives you information about how to care for yourself after your procedure. Your health care provider may also give you more specific instructions. If you have problems or questions, contact your health care provider. What can I expect after the procedure? After the procedure, it is common to  have:  Discomfort or burning when urinating.  An increased urge to urinate.  More frequent urination.  Urine that is slightly blood-tinged.  These symptoms should go away after a few days. Follow these instructions at home:  Take over-the-counter and prescription medicines only as told by your health care provider.  Do not drive for 24 hours if you were given a medicine to help you relax (sedative).  Do not drive or use heavy machinery while taking prescription pain medicine.  Do not lift anything that is heavier than 10 lb (4.5 kg) until your health care provider says that this is safe.  Return to your normal activities as told by your health care provider. Ask your health care provider what activities are safe for you. Ask when you can return to sexual activity.  Drink enough fluid to keep your urine clear or pale yellow.  Keep all follow-up visits as told by your health care provider. This is important. Contact a health care provider if:  You have chills or a fever.  You have pain when passing urine.  You have bright red blood or blood clots in your urine.  You have difficulty passing urine.  You have leaking of urine (incontinence). Get help right away if:  You have chest pain or shortness of breath.  You have leg pain or swelling.  You cannot pass urine. Summary  After the procedure, it is common to have discomfort or burning when urinating, an increased urge to urinate, more frequent urination, and urine that is slightly blood-tinged.  Do not drive for 24 hours if you were given a medicine to help you relax (sedative). Do not drive or use heavy machinery while taking prescription pain medicine.  Do not lift anything that is heavier than 10 lb (4.5 kg) until your health care provider says that this is safe.  Return to your normal activities as told by your health care provider. This information is not intended to replace advice given to you by your health  care provider. Make sure you discuss any questions you have with your health care provider. Document Released: 12/25/2016 Document Revised: 12/25/2016 Document Reviewed: 12/25/2016 Elsevier Interactive Patient Education  2018 [employer].  Post Anesthesia Home Care Instructions  Activity: Get plenty of rest for the remainder of the day. A responsible individual must stay with you for 24 hours following the procedure.  For the next 24 hours, DO NOT: -Drive a car -Paediatric nurse -Drink alcoholic beverages -Take any medication unless instructed by your physician -Make any legal decisions or sign important papers.  Meals: Start with liquid foods such as gelatin or soup. Progress to regular foods as tolerated. Avoid greasy, spicy, heavy foods. If nausea and/or vomiting occur, drink only clear liquids until the nausea and/or vomiting subsides. Call your physician if vomiting continues.  Special Instructions/Symptoms: Your throat may feel dry or sore from the anesthesia or the breathing tube placed in your throat during surgery. If this causes discomfort, gargle with warm salt water. The discomfort should disappear within 24 hours.

## 2018-03-18 NOTE — Transfer of Care (Signed)
Immediate Anesthesia Transfer of Care Note  Patient: Scott Newton. Scott Newton  Procedure(s) Performed: CYSTOSCOPY WITH FULGERATION/ BLADDER BIOPSY (N/A ) CYSTOSCOPY WITH INSERTION OF UROLIFT (N/A )  Patient Location: PACU  Anesthesia Type:General  Level of Consciousness: awake, alert, oriented  Airway & Oxygen Therapy: Patient Spontanous Breathing and Patient connected to nasal cannula oxygen  Post-op Assessment: Report given to RN and Post -op Vital signs reviewed and stable  Post vital signs: Reviewed and stable  Last Vitals:  Vitals Value Taken Time  BP 147/92 03/18/2018 12:47 PM  Temp    Pulse 76 03/18/2018 12:51 PM  Resp 13 03/18/2018 12:51 PM  SpO2 100 % 03/18/2018 12:51 PM  Vitals shown include unvalidated device data.  Last Pain:  Vitals:   03/18/18 0936  TempSrc:   PainSc: 0-No pain      Patients Stated Pain Goal: 8 (28/76/81 1572)  Complications: No apparent anesthesia complications

## 2018-03-18 NOTE — Anesthesia Postprocedure Evaluation (Signed)
Anesthesia Post Note  Patient: Scott Newton. [place]  Procedure(s) Performed: CYSTOSCOPY WITH FULGERATION/ BLADDER BIOPSY (N/A ) CYSTOSCOPY WITH INSERTION OF UROLIFT (N/A )     Patient location during evaluation: PACU Anesthesia Type: General Level of consciousness: awake and alert Pain management: pain level controlled Vital Signs Assessment: post-procedure vital signs reviewed and stable Respiratory status: spontaneous breathing, nonlabored ventilation, respiratory function stable and patient connected to nasal cannula oxygen Cardiovascular status: blood pressure returned to baseline and stable Postop Assessment: no apparent nausea or vomiting Anesthetic complications: no    Last Vitals:  Vitals:   03/18/18 1300 03/18/18 1315  BP: (!) 153/85 (!) 143/89  Pulse: (!) 59 72  Resp: (!) 25 16  Temp: (!) 36.3 C   SpO2: 100% 100%    Last Pain:  Vitals:   03/18/18 1321  TempSrc:   PainSc: 0-No pain                 [name] DAVID

## 2018-03-18 NOTE — Addendum Note (Signed)
Addendum  created 03/18/18 1328 by Lillia Abed, MD   Attestation recorded in [place], [place] filed

## 2018-03-19 ENCOUNTER — Encounter (HOSPITAL_BASED_OUTPATIENT_CLINIC_OR_DEPARTMENT_OTHER): Payer: Self-pay | Admitting: Urology

## 2018-03-21 DIAGNOSIS — N401 Enlarged prostate with lower urinary tract symptoms: Secondary | ICD-10-CM | POA: Diagnosis not present

## 2018-03-21 DIAGNOSIS — R3912 Poor urinary stream: Secondary | ICD-10-CM | POA: Diagnosis not present

## 2018-06-25 DIAGNOSIS — R3121 Asymptomatic microscopic hematuria: Secondary | ICD-10-CM | POA: Diagnosis not present

## 2018-06-25 DIAGNOSIS — N5201 Erectile dysfunction due to arterial insufficiency: Secondary | ICD-10-CM | POA: Diagnosis not present

## 2018-06-25 DIAGNOSIS — N401 Enlarged prostate with lower urinary tract symptoms: Secondary | ICD-10-CM | POA: Diagnosis not present

## 2018-06-25 DIAGNOSIS — R3912 Poor urinary stream: Secondary | ICD-10-CM | POA: Diagnosis not present

## 2018-08-31 ENCOUNTER — Other Ambulatory Visit (HOSPITAL_COMMUNITY): Payer: Self-pay | Admitting: Cardiology

## 2018-09-01 NOTE — Telephone Encounter (Signed)
Scr 0.7 on 01/20/18

## 2018-09-18 ENCOUNTER — Telehealth: Payer: Self-pay | Admitting: *Deleted

## 2018-09-18 NOTE — Telephone Encounter (Signed)
Patient walk in with [employer] application for assistance with ELIQUIS. Application is incomplete and patient called and notified by answering machine, no answer on cell phone that patient requested a call on. I have completed provider part and will place in Dr Landis Gandy mailbox for signature.

## 2018-09-19 DIAGNOSIS — Z23 Encounter for immunization: Secondary | ICD-10-CM | POA: Diagnosis not present

## 2018-09-22 NOTE — Telephone Encounter (Signed)
**Note De-Identified Wilhelmina Hark Obfuscation** Dr Radford Pax has signed the pts [employer] pt asst application and I have placed it in the yellow folder in the PA Dept.

## 2018-09-22 NOTE — Telephone Encounter (Signed)
-----   Message from [place] sent at 09/19/2018  9:30 AM EDT ----- Contact: [phone] Joanette Gula,   Patient called yesterday asking for eliquis paperwork. Called was transferred to me believe this belongs to you.   Thanks, Maudie Mercury

## 2018-09-22 NOTE — Telephone Encounter (Signed)
The pt is advised that we need his 2018 proof of income and his 2019 out of pocket expense report from his pharmacy. He verbalized understanding and states that he will bring the needed documents today or tomorrow.

## 2018-09-25 NOTE — Telephone Encounter (Signed)
Faxed completed [employer] application for help with [employer]. Provider portion also faxed.

## 2018-09-30 NOTE — Telephone Encounter (Signed)
**Note De-Identified Scott Newton Obfuscation** Letter received from [employer] stating that they have denied pt asst to the pt for Eliquis.  Reason: Product covered by insurance.

## 2018-10-21 NOTE — Telephone Encounter (Signed)
Letter received via fax from [facility] stating that they have approved the pt for asst with Eliquis. Approval good from 10/06/18-11/18/18

## 2018-11-02 ENCOUNTER — Other Ambulatory Visit: Payer: Self-pay | Admitting: Cardiology

## 2019-01-13 DIAGNOSIS — R310 Gross hematuria: Secondary | ICD-10-CM | POA: Diagnosis not present

## 2019-01-14 ENCOUNTER — Other Ambulatory Visit: Payer: Self-pay

## 2019-01-14 ENCOUNTER — Other Ambulatory Visit: Payer: Self-pay | Admitting: *Deleted

## 2019-01-14 MED ORDER — APIXABAN 5 MG PO TABS: 5.0000 mg | ORAL_TABLET | Freq: Two times a day (BID) | ORAL | 1 refills | Status: DC

## 2019-01-14 NOTE — Telephone Encounter (Signed)
Eliquis 5mg  refill request received; pt is 73 yrs old, wt-76.9kg, Crea-0.70 via [facility] on 01/20/2018, last seen by [name] on 03/11/2018 (Dr. Radford Pax pt). Medication refill was sent today by Jinny Blossom PhD today for 180 tabs with 1 refill & a note was placed on 03/18/2019 appt to obtain CBC & BMET.

## 2019-01-14 NOTE — Telephone Encounter (Signed)
He has not been seen by me in almost 2 years. He will need to get his refill from his PCP until he gets an appt with me

## 2019-01-16 ENCOUNTER — Other Ambulatory Visit: Payer: Self-pay | Admitting: Cardiology

## 2019-01-16 MED ORDER — APIXABAN 5 MG PO TABS: 5.0000 mg | ORAL_TABLET | Freq: Two times a day (BID) | ORAL | 0 refills | Status: DC

## 2019-01-16 NOTE — Telephone Encounter (Signed)
Pt last saw [name], [place] on 03/11/18, has upcoming appt with Dr Radford Pax on 03/18/19.  Last labs 01/20/18 Cr 0.7 [facility], age 73, weight 76.9kg, based on specified criteria pt is on appropriate dosage of Eliquis 5mg  BID.  Will refill rx x 1 90 day supply. Note on upcoming appt pt needs CBC and BMP for Eliquis f/u.

## 2019-01-16 NOTE — Telephone Encounter (Signed)
New Message         *STAT* If patient is at the pharmacy, call can be transferred to refill team.   1. Which medications need to be refilled? (please list name of each medication and dose if known)  Eliquis 5 mg  2. Which pharmacy/location (including street and city if local pharmacy) is medication to be sent to? [facility] [place]  3. Do they need a 30 day or 90 day supply? [place]

## 2019-01-20 DIAGNOSIS — K429 Umbilical hernia without obstruction or gangrene: Secondary | ICD-10-CM | POA: Diagnosis not present

## 2019-01-20 DIAGNOSIS — I4891 Unspecified atrial fibrillation: Secondary | ICD-10-CM | POA: Diagnosis not present

## 2019-01-20 DIAGNOSIS — Z0000 Encounter for general adult medical examination without abnormal findings: Secondary | ICD-10-CM | POA: Diagnosis not present

## 2019-01-20 DIAGNOSIS — R7309 Other abnormal glucose: Secondary | ICD-10-CM | POA: Diagnosis not present

## 2019-01-20 DIAGNOSIS — R319 Hematuria, unspecified: Secondary | ICD-10-CM | POA: Diagnosis not present

## 2019-01-20 DIAGNOSIS — I10 Essential (primary) hypertension: Secondary | ICD-10-CM | POA: Diagnosis not present

## 2019-03-10 DIAGNOSIS — R3912 Poor urinary stream: Secondary | ICD-10-CM | POA: Diagnosis not present

## 2019-03-10 DIAGNOSIS — R310 Gross hematuria: Secondary | ICD-10-CM | POA: Diagnosis not present

## 2019-03-10 DIAGNOSIS — N401 Enlarged prostate with lower urinary tract symptoms: Secondary | ICD-10-CM | POA: Diagnosis not present

## 2019-03-10 DIAGNOSIS — N323 Diverticulum of bladder: Secondary | ICD-10-CM | POA: Diagnosis not present

## 2019-03-16 ENCOUNTER — Telehealth: Payer: Self-pay | Admitting: Cardiology

## 2019-03-16 NOTE — Telephone Encounter (Signed)
Video/[email or web address]/smartphone [phone]/verbal consent 03/16/19/vitals  YOUR CARDIOLOGY TEAM HAS ARRANGED FOR AN E-VISIT FOR YOUR APPOINTMENT - PLEASE REVIEW IMPORTANT INFORMATION BELOW SEVERAL DAYS PRIOR TO YOUR APPOINTMENT  Due to the recent COVID-19 pandemic, we are transitioning in-person office visits to tele-medicine visits in an effort to decrease unnecessary exposure to our patients, their families, and staff. These visits are billed to your insurance just like a normal visit is. We also encourage you to sign up for MyChart if you have not already done so. You will need a smartphone if possible. For patients that do not have this, we can still complete the visit using a regular telephone but do prefer a smartphone to enable video when possible. You may have a family member that lives with you that can help. If possible, we also ask that you have a blood pressure cuff and scale at home to measure your blood pressure, heart rate and weight prior to your scheduled appointment. Patients with clinical needs that need an in-person evaluation and testing will still be able to come to the office if absolutely necessary. If you have any questions, feel free to call our office.      YOUR PROVIDER WILL BE USING THE FOLLOWING PLATFORM TO COMPLETE YOUR VISIT:   [email or web address]   IF USING MYCHART - How to Download the MyChart App to Your SmartPhone   - If Apple, go to [employer] and type in MyChart in the search bar and download the app. If Android, ask patient to go to [employer] and type in Friendly in the search bar and download the app. The app is free but as with any other app downloads, your phone may require you to verify saved payment information or Apple/Android password.  - You will need to then log into the app with your MyChart username and password, and select [facility] as your healthcare provider to link the account.  - When it is time for your visit, go to the MyChart app, find appointments,  and click Begin Video Visit. Be sure to Select Allow for your device to access the Microphone and Camera for your visit. You will then be connected, and your provider will be with you shortly.  **If you have any issues connecting or need assistance, please contact MyChart service desk [phone])**  **If using a computer, in order to ensure the best quality for your visit, you will need to use either of the following Internet Browsers: Insurance underwriter or Microsoft Edge**   IF USING DOXIMITY or [email or web address].ME - The staff will give you instructions on receiving your link to join the meeting the day of your visit.      2-3 DAYS BEFORE YOUR APPOINTMENT  You will receive a telephone call from one of our [facility] team members - your caller ID may say "Unknown caller." If this is a video visit, we will walk you through how to get the video launched on your phone. We will remind you check your blood pressure, heart rate and weight prior to your scheduled appointment. If you have an Apple Watch or Kardia, please upload any pertinent ECG strips the day before or morning of your appointment to [place]. Our staff will also make sure you have reviewed the consent and agree to move forward with your scheduled tele-health visit.     THE DAY OF YOUR APPOINTMENT  Approximately 15 minutes prior to your scheduled appointment, you will receive a telephone call from one of [facility] team -  your caller ID may say "Unknown caller."  Our staff will confirm medications, vital signs for the day and any symptoms you may be experiencing. Please have this information available prior to the time of visit start. It may also be helpful for you to have a pad of paper and pen handy for any instructions given during your visit. They will also walk you through joining the smartphone meeting if this is a video visit.    CONSENT FOR TELE-HEALTH VISIT - PLEASE REVIEW  I hereby voluntarily request, consent and authorize  [facility] and its employed or contracted physicians, physician assistants, nurse practitioners or other licensed health care professionals (the Practitioner), to provide me with telemedicine health care services (the Services") as deemed necessary by the treating Practitioner. I acknowledge and consent to receive the Services by the Practitioner via telemedicine. I understand that the telemedicine visit will involve communicating with the Practitioner through live audiovisual communication technology and the disclosure of certain medical information by electronic transmission. I acknowledge that I have been given the opportunity to request an in-person assessment or other available alternative prior to the telemedicine visit and am voluntarily participating in the telemedicine visit.  I understand that I have the right to withhold or withdraw my consent to the use of telemedicine in the course of my care at any time, without affecting my right to future care or treatment, and that the Practitioner or I may terminate the telemedicine visit at any time. I understand that I have the right to inspect all information obtained and/or recorded in the course of the telemedicine visit and may receive copies of available information for a reasonable fee.  I understand that some of the potential risks of receiving the Services via telemedicine include:   Delay or interruption in medical evaluation due to technological equipment failure or disruption;  Information transmitted may not be sufficient (e.g. poor resolution of images) to allow for appropriate medical decision making by the Practitioner; and/or   In rare instances, security protocols could fail, causing a breach of personal health information.  Furthermore, I acknowledge that it is my responsibility to provide information about my medical history, conditions and care that is complete and accurate to the best of my ability. I acknowledge that  Practitioner's advice, recommendations, and/or decision may be based on factors not within their control, such as incomplete or inaccurate data provided by me or distortions of diagnostic images or specimens that may result from electronic transmissions. I understand that the practice of medicine is not an exact science and that Practitioner makes no warranties or guarantees regarding treatment outcomes. I acknowledge that I will receive a copy of this consent concurrently upon execution via email to the email address I last provided but may also request a printed copy by calling the office of [facility].    I understand that my insurance will be billed for this visit.   I have read or had this consent read to me.  I understand the contents of this consent, which adequately explains the benefits and risks of the Services being provided via telemedicine.   I have been provided ample opportunity to ask questions regarding this consent and the Services and have had my questions answered to my satisfaction.  I give my informed consent for the services to be provided through the use of telemedicine in my medical care  By participating in this telemedicine visit I agree to the above.

## 2019-03-17 NOTE — Progress Notes (Signed)
Virtual Visit via Video Note   This visit type was conducted due to national recommendations for restrictions regarding the COVID-19 Pandemic (e.g. social distancing) in an effort to limit this patient's exposure and mitigate transmission in our community.  Due to his co-morbid illnesses, this patient is at least at moderate risk for complications without adequate follow up.  This format is felt to be most appropriate for this patient at this time.  All issues noted in this document were discussed and addressed.  A limited physical exam was performed with this format.  Please refer to the patient's chart for his consent to telehealth for [facility].   Evaluation Performed:  Follow-up visit  This visit type was conducted due to national recommendations for restrictions regarding the COVID-19 Pandemic (e.g. social distancing).  This format is felt to be most appropriate for this patient at this time.  All issues noted in this document were discussed and addressed.  No physical exam was performed (except for noted visual exam findings with Video Visits).  Please refer to the patient's chart (MyChart message for video visits and phone note for telephone visits) for the patient's consent to telehealth for [facility].  Date:  03/18/2019   ID:  Scott Newton, [place] 1946-04-16, MRN [id number]  Patient Location:  Home  Provider location:   [place]  PCP:  London Pepper, MD  Cardiologist:  Fransico Him, MD Electrophysiologist:  None   Chief Complaint:  Chronic atrial fibrillation, HTN  History of Present Illness:    Scott Newton is a 73 y.o. male who presents via Engineer, civil (consulting) for a telehealth visit today.    Scott Newton is a 73 y.o. 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  2D echo 11/16 showed normal LVEF and wall motion. EF was 60-65%. He is on chronic  Eliquis for CHADS2VASC score of 2. He is here today for followup and is doing well.  He denies any chest pain or pressure, SOB, DOE, PND, orthopnea, LE edema, dizziness, palpitations or syncope. He is compliant with his meds and is tolerating meds with no SE.    The patient does not have symptoms concerning for COVID-19 infection (fever, chills, cough, or new shortness of breath).    Prior CV studies:   The following studies were reviewed today:  None  Past Medical History:  Diagnosis Date  . Anticoagulant long-term use    eliquis  . BPH with obstruction/lower urinary tract symptoms   . Chronic atrial fibrillation cardiologist-  dr Tressia Miners turner   dx 10/ 2016---  s/p DCCV with reversion back to afib now pursuing rate control. 10-19-2015  . ED (erectile dysfunction)   . Essential hypertension   . Full dentures   . Gross hematuria   . Peyronie's disease   . Umbilical hernia   . Weak urinary stream   . Wears glasses    Past Surgical History:  Procedure Laterality Date  . CARDIOVERSION N/A 10/19/2015   Procedure: CARDIOVERSION;  Surgeon: Josue Hector, MD;  Location: [facility] ENDOSCOPY;  Service: Cardiovascular;  Laterality: N/A;  . CYSTOSCOPY WITH FULGERATION N/A 03/18/2018   Procedure: CYSTOSCOPY WITH FULGERATION/ BLADDER BIOPSY;  Surgeon: Festus Aloe, MD;  Location: [facility];  Service: Urology;  Laterality: N/A;  . CYSTOSCOPY WITH INSERTION OF UROLIFT N/A 03/18/2018   Procedure: CYSTOSCOPY WITH INSERTION OF UROLIFT;  Surgeon: Festus Aloe, MD;  Location: [place];  Service: Urology;  Laterality: N/A;  . KNEE ARTHROSCOPY Left 2009  approx.  . TONSILLECTOMY  child  . TRANSTHORACIC ECHOCARDIOGRAM  09-27-2015  dr Tressia Miners turner   mild focal basal hypertrophy of the septum,  ef 60-65%/  trivial AR/  mild MR/  severe LAE/       Current Meds  Medication Sig  . apixaban (ELIQUIS) 5 MG TABS tablet Take 1 tablet (5 mg total) by mouth 2 (two) times  daily.  . metoprolol succinate (TOPROL-XL) 50 MG 24 hr tablet Take 75 mg by mouth daily. Take with or immediately following a meal.  . Multiple Vitamins-Minerals (MULTIVITAMIN WITH MINERALS) tablet Take 1 tablet by mouth daily.  . [DISCONTINUED] metoprolol succinate (TOPROL-XL) 50 MG 24 hr tablet Take 2 tablets (100 mg total) by mouth every morning. TAKE 1 1/2 TABLET BY MOUTH DAILY WITH OR IMMEDIATELY FOLLOWING A MEAL. (Patient taking differently: Take 75 mg by mouth. TAKE 1 1/2 TABLET BY MOUTH DAILY WITH OR IMMEDIATELY FOLLOWING A MEAL.)     Allergies:   Patient has no known allergies.   Social History   Tobacco Use  . Smoking status: Current Some Day Smoker    Years: 20.00    Types: Cigars, Cigarettes  . Smokeless tobacco: Never Used  . Tobacco comment: 03-13-2018 per pt quit cigarettes 1990s, but currently smokes occasional cigar  Substance Use Topics  . Alcohol use: Yes    Alcohol/week: 14.0 standard drinks    Types: 14 Glasses of wine per week    Comment: average 2 wine daily  . Drug use: No     Family Hx: The patient's family history includes Heart attack (age of onset: 106) in his father; Heart attack (age of onset: 56) in his brother; Heart disease (age of onset: 50) in his father; Hypertension (age of onset: 81) in his mother. There is no history of Stroke.  ROS:   Please see the history of present illness.     All other systems reviewed and are negative.   Labs/Other Tests and Data Reviewed:    Recent Labs: 03/18/2018: Hemoglobin 15.3; Potassium 4.3; Sodium 142   Recent Lipid Panel No results found for: CHOL, TRIG, HDL, CHOLHDL, LDLCALC, LDLDIRECT  Wt Readings from Last 3 Encounters:  03/18/19 170 lb (77.1 kg)  03/18/18 169 lb 8 oz (76.9 kg)  03/11/18 169 lb 9.6 oz (76.9 kg)     Objective:    Vital Signs:  BP 128/86   Pulse 87   Ht 5\' 8"  (1.727 m)   Wt 170 lb (77.1 kg)   BMI 25.85 kg/m    CONSTITUTIONAL:  Well nourished, well developed male in no acute  distress.  EYES: anicteric MOUTH: oral mucosa is pink RESPIRATORY: Normal respiratory effort, symmetric expansion CARDIOVASCULAR: No peripheral edema SKIN: No rash, lesions or ulcers MUSCULOSKELETAL: no digital cyanosis NEURO: Cranial Nerves II-XII grossly intact, moves all extremities PSYCH: Intact judgement and insight.  A&O x 3, Mood/affect appropriate   ASSESSMENT & PLAN:    1.  Chronic atrial fibrillation - he denies any palpitations and HR is controlled.  He will continue on Toprol XL 100mg  daily and apixaban 5mg  BID.  He has not had any problems with excessive bleeding.  His creainine was 0.89 on 01/20/2019.  I will get a copy of Hbg from PCP.  2.  Hypertension - he checks his BP at home and it is controlled.  He will continue on Toprol XL 75mg  daily.  3.  COVID-19 Education:The signs and symptoms of COVID-19 were discussed with the patient and how to seek care for testing (follow up with PCP or arrange E-visit).  The importance of social distancing was discussed today.  Patient Risk:   After full review of this patient's clinical status, I feel that they are at least moderate risk at this time.  Time:   Today, I have spent 20 minutes directly with the patient on Video discussing medical problems including Atrial fibrillation and HTN.  We also reviewed the symptoms of COVID 19 and the ways to protect against contracting the virus with telehealth technology.  I spent an additional 5 minutes reviewing patient's chart including prior office notes.  Medication Adjustments/Labs and Tests Ordered: Current medicines are reviewed at length with the patient today.  Concerns regarding medicines are outlined above.  Tests Ordered: No orders of the defined types were placed in this encounter.  Medication Changes: No orders of the defined types were placed in this encounter.   Disposition:  Follow up in 6 month(s)  Signed, Fransico Him, MD  03/18/2019 8:10 AM    [place]

## 2019-03-18 ENCOUNTER — Telehealth (INDEPENDENT_AMBULATORY_CARE_PROVIDER_SITE_OTHER): Payer: PPO | Admitting: Cardiology

## 2019-03-18 ENCOUNTER — Encounter: Payer: Self-pay | Admitting: Cardiology

## 2019-03-18 ENCOUNTER — Other Ambulatory Visit: Payer: Self-pay

## 2019-03-18 VITALS — BP 128/86 | HR 87 | Ht 68.0 in | Wt 170.0 lb

## 2019-03-18 DIAGNOSIS — I10 Essential (primary) hypertension: Secondary | ICD-10-CM | POA: Diagnosis not present

## 2019-03-18 DIAGNOSIS — Z7189 Other specified counseling: Secondary | ICD-10-CM

## 2019-03-18 DIAGNOSIS — I482 Chronic atrial fibrillation, unspecified: Secondary | ICD-10-CM

## 2019-03-18 NOTE — Patient Instructions (Signed)
Medication Instructions:  Your physician recommends that you continue on your current medications as directed. Please refer to the Current Medication list given to you today.  If you need a refill on your cardiac medications before your next appointment, please call your pharmacy.   Lab work: None If you have labs (blood work) drawn today and your tests are completely normal, you will receive your results only by: . MyChart Message (if you have MyChart) OR . A paper copy in the mail If you have any lab test that is abnormal or we need to change your treatment, we will call you to review the results.  Testing/Procedures: None  Follow-Up: At [facility], you and your health needs are our priority.  As part of our continuing mission to provide you with exceptional heart care, we have created designated Provider Care Teams.  These Care Teams include your primary Cardiologist (physician) and Advanced Practice Providers (APPs -  Physician Assistants and Nurse Practitioners) who all work together to provide you with the care you need, when you need it. You will need a follow up appointment in 6 months.  Please call our office 2 months in advance to schedule this appointment.  You may see Dr. Turner or one of the following Advanced Practice Providers on your designated Care Team:   Brittainy Simmons, PA-C Dayna Dunn, PA-C . Michele Lenze, PA-C     

## 2019-04-16 ENCOUNTER — Other Ambulatory Visit: Payer: Self-pay

## 2019-04-16 MED ORDER — APIXABAN 5 MG PO TABS: 5.0000 mg | ORAL_TABLET | Freq: Two times a day (BID) | ORAL | 0 refills | Status: DC

## 2019-07-07 DIAGNOSIS — N401 Enlarged prostate with lower urinary tract symptoms: Secondary | ICD-10-CM | POA: Diagnosis not present

## 2019-07-28 DIAGNOSIS — R3912 Poor urinary stream: Secondary | ICD-10-CM | POA: Diagnosis not present

## 2019-07-28 DIAGNOSIS — R310 Gross hematuria: Secondary | ICD-10-CM | POA: Diagnosis not present

## 2019-07-28 DIAGNOSIS — N401 Enlarged prostate with lower urinary tract symptoms: Secondary | ICD-10-CM | POA: Diagnosis not present

## 2019-08-04 DIAGNOSIS — Z23 Encounter for immunization: Secondary | ICD-10-CM | POA: Diagnosis not present

## 2019-08-12 ENCOUNTER — Other Ambulatory Visit: Payer: Self-pay | Admitting: Cardiology

## 2019-08-12 MED ORDER — METOPROLOL SUCCINATE ER 50 MG PO TB24: ORAL_TABLET | ORAL | 2 refills | Status: DC

## 2019-08-13 ENCOUNTER — Other Ambulatory Visit: Payer: Self-pay | Admitting: Urology

## 2019-08-13 ENCOUNTER — Telehealth: Payer: Self-pay | Admitting: Cardiology

## 2019-08-13 NOTE — Telephone Encounter (Signed)
Please comment on eliquis. 

## 2019-08-13 NOTE — Telephone Encounter (Signed)
New message      [facility] Pre-operative Risk Assessment    Request for surgical clearance:  1. What type of surgery is being performed? Cystoscopy Bladder Biopsy-Fulguration  2. When is this surgery scheduled?09/11/19  3. What type of clearance is required (medical clearance vs. Pharmacy clearance to hold med vs. Both)? Both  4. Are there any medications that need to be held prior to surgery and how long?Eliquis 3 days prior   5. Practice name and name of physician performing surgery? [facility], Dr. Junious Silk   6. What is your office phone [phone]   7.   What is your office fax number [phone]  8.   Anesthesia type (None, local, MAC, general) ? general   Scott Newton 08/13/2019, 4:35 PM  _________________________________________________________________   (provider comments below)

## 2019-08-14 NOTE — Telephone Encounter (Signed)
   Primary Cardiologist: Fransico Him, MD  Chart reviewed as part of pre-operative protocol coverage. Patient was contacted 08/14/2019 in reference to pre-operative risk assessment for pending surgery as outlined below.  Chloe C. Grounds was last seen on 03/16/2019 via virtual visit by Dr. Radford Pax.  Since that day, Cordale Filsinger. Charon has done well without chest pain or worsening dyspnea.  Therefore, based on ACC/AHA guidelines, the patient would be at acceptable risk for the planned procedure without further cardiovascular testing.   I will route this recommendation to the requesting party via Epic fax function and remove from pre-op pool.  Please call with questions. Per our clinical pharmacist, ok to hold Eliquis for 3 days prior to the procedure and restart as soon as possible afterward at the discretion of Dr. Alger Simons based on bleeding risk  Almyra Deforest, [place] 08/14/2019, 10:46 AM

## 2019-08-14 NOTE — Telephone Encounter (Signed)
Patient with diagnosis of ATRIAL FIBRILLATION on ELIQUIS for anticoagulation.    Procedure: CYSTOSCOPY BLADDER BIOPSY Date of procedure: 09/11/2019  CHADS2-VASc score of  2  (HTN, AGE)  CrCl = 82ML/MIN   OKAY TO HOLD  ELIQUIS for 3 days prior to procedure

## 2019-09-04 ENCOUNTER — Other Ambulatory Visit: Payer: Self-pay | Admitting: Urology

## 2019-09-06 NOTE — Progress Notes (Signed)
Cardiology Office Note:    Date:  09/07/2019   ID:  Marcello Moores C. Zaya, [place] [birth year], MRN [id number]  PCP:  London Pepper, MD  Cardiologist:  Fransico Him, MD    Referring MD: London Pepper, MD   Chief Complaint  Patient presents with  . Atrial Fibrillation  . Hypertension    History of Present Illness:    Scott Newton is a 73 y.o. male with a hx of HTN, permanentatrial fibrillationwith subsequent andDCCV andreccurrent afib andwas asymptomaticandopted for rate control. 2D echo 11/16 showed normal LVEF and wall motion. EF was 60-65%. He is on chronic Eliquis for CHADS2VASC score of 2.  He is here today for followup and is doing well.  He denies any chest pain or pressure, SOB, DOE, PND, orthopnea, LE edema, dizziness, palpitations or syncope. He is compliant with his meds and is tolerating meds with no SE.    Past Medical History:  Diagnosis Date  . Anticoagulant long-term use    eliquis  . BPH with obstruction/lower urinary tract symptoms   . Chronic atrial fibrillation [employer]) cardiologist-  dr Tressia Miners Aanika Defoor   dx 10/ 2016---  s/p DCCV with reversion back to afib now pursuing rate control. 10-19-2015  . ED (erectile dysfunction)   . Essential hypertension   . Full dentures   . Gross hematuria   . Peyronie's disease   . Umbilical hernia   . Weak urinary stream   . Wears glasses     Past Surgical History:  Procedure Laterality Date  . CARDIOVERSION N/A 10/19/2015   Procedure: CARDIOVERSION;  Surgeon: Josue Hector, MD;  Location: [facility] ENDOSCOPY;  Service: Cardiovascular;  Laterality: N/A;  . CYSTOSCOPY WITH FULGERATION N/A 03/18/2018   Procedure: CYSTOSCOPY WITH FULGERATION/ BLADDER BIOPSY;  Surgeon: Festus Aloe, MD;  Location: [facility];  Service: Urology;  Laterality: N/A;  . CYSTOSCOPY WITH INSERTION OF UROLIFT N/A 03/18/2018   Procedure: CYSTOSCOPY WITH INSERTION OF UROLIFT;  Surgeon: Festus Aloe, MD;  Location: [facility];  Service: Urology;  Laterality: N/A;  . KNEE ARTHROSCOPY Left 2009  approx.  . TONSILLECTOMY  child  . TRANSTHORACIC ECHOCARDIOGRAM  09-27-2015  dr Tressia Miners Viviane Semidey   mild focal basal hypertrophy of the septum,  ef 60-65%/  trivial AR/  mild MR/  severe LAE/      Current Medications: Current Meds  Medication Sig  . acetaminophen (TYLENOL) 500 MG tablet Take 500 mg by mouth every 6 (six) hours as needed for moderate pain or headache.  Marland Kitchen apixaban (ELIQUIS) 5 MG TABS tablet Take 1 tablet (5 mg total) by mouth 2 (two) times daily.  . metoprolol succinate (TOPROL-XL) 50 MG 24 hr tablet Take 1 1/2 (75 mg total) tablet by mouth daily.Take with or immediately following a meal.  . Multiple Vitamins-Minerals (MULTIVITAMIN WITH MINERALS) tablet Take 1 tablet by mouth 2 (two) times a week.   . sildenafil (REVATIO) 20 MG tablet Take 20-100 mg by mouth daily as needed for erectile dysfunction.     Allergies:   Patient has no known allergies.   Social History   Socioeconomic History  . Marital status: Married    Spouse name: Not on file  . Number of children: 3  . Years of education: college  . Highest education level: Not on file  Occupational History  . Occupation: unemployed  Social Needs  . Financial resource strain: Not on file  . Food insecurity    Worry: Not on file  Inability: Not on file  . Transportation needs    Medical: Not on file    Non-medical: Not on file  Tobacco Use  . Smoking status: Current Some Day Smoker    Years: 20.00    Types: Cigars, Cigarettes  . Smokeless tobacco: Never Used  . Tobacco comment: 03-13-2018 per pt quit cigarettes 1990s, but currently smokes occasional cigar  Substance and Sexual Activity  . Alcohol use: Yes    Alcohol/week: 14.0 standard drinks    Types: 14 Glasses of wine per week    Comment: average 2 wine daily  . Drug use: No  . Sexual activity: Not on file  Lifestyle  . Physical activity    Days per week: Not on file     Minutes per session: Not on file  . Stress: Not on file  Relationships  . Social Herbalist on phone: Not on file    Gets together: Not on file    Attends religious service: Not on file    Active member of club or organization: Not on file    Attends meetings of clubs or organizations: Not on file    Relationship status: Not on file  Other Topics Concern  . Not on file  Social History Narrative  . Not on file     Family History: The patient's family history includes Heart attack (age of onset: 44) in his father; Heart attack (age of onset: 50) in his brother; Heart disease (age of onset: 12) in his father; Hypertension (age of onset: 10) in his mother. There is no history of Stroke.  ROS:   Please see the history of present illness.    ROS  All other systems reviewed and negative.   EKGs/Labs/Other Studies Reviewed:    The following studies were reviewed today: none  EKG:  EKG is  ordered today.  The ekg ordered today demonstrates atrial fibrillation with IRBBB and no ST changes  Recent Labs: No results found for requested labs within last 8760 hours.   Recent Lipid Panel No results found for: CHOL, TRIG, HDL, CHOLHDL, VLDL, LDLCALC, LDLDIRECT  Physical Exam:    VS:  BP (!) 142/90   Pulse 79   Ht 5\' 8"  (1.727 m)   Wt 176 lb (79.8 kg)   SpO2 97%   BMI 26.76 kg/m     Wt Readings from Last 3 Encounters:  09/07/19 176 lb (79.8 kg)  03/18/19 170 lb (77.1 kg)  03/18/18 169 lb 8 oz (76.9 kg)     GEN:  Well nourished, well developed in no acute distress HEENT: Normal NECK: No JVD; No carotid bruits LYMPHATICS: No lymphadenopathy CARDIAC: irregularly irregular, no murmurs, rubs, gallops RESPIRATORY:  Clear to auscultation without rales, wheezing or rhonchi  ABDOMEN: Soft, non-tender, non-distended MUSCULOSKELETAL:  No edema; No deformity  SKIN: Warm and dry NEUROLOGIC:  Alert and oriented x 3 PSYCHIATRIC:  Normal affect   ASSESSMENT:    1. Chronic  atrial fibrillation ([place])   2. Essential hypertension    PLAN:    In order of problems listed above:  1.  Chronic atrial fibrillation -HR is controlled -continue Toprol XL 25mg  daily and Eliquis 5mg  BID -he denies any bleeding problems on DOAC. -Creatinine 0.89 on 01/2019 and Hb 15  2.  HTN -BP controlled on exam -continue Toprol XL 50mg  daily.   Medication Adjustments/Labs and Tests Ordered: Current medicines are reviewed at length with the patient today.  Concerns regarding medicines are outlined  above.  Orders Placed This Encounter  Procedures  . EKG 12-Lead   No orders of the defined types were placed in this encounter.   Signed, Fransico Him, MD  09/07/2019 8:11 AM    [place]

## 2019-09-07 ENCOUNTER — Ambulatory Visit: Payer: PPO | Admitting: Cardiology

## 2019-09-07 ENCOUNTER — Encounter: Payer: Self-pay | Admitting: Cardiology

## 2019-09-07 ENCOUNTER — Other Ambulatory Visit: Payer: Self-pay

## 2019-09-07 VITALS — BP 142/90 | HR 79 | Ht 68.0 in | Wt 176.0 lb

## 2019-09-07 DIAGNOSIS — I482 Chronic atrial fibrillation, unspecified: Secondary | ICD-10-CM | POA: Diagnosis not present

## 2019-09-07 DIAGNOSIS — I10 Essential (primary) hypertension: Secondary | ICD-10-CM

## 2019-09-07 NOTE — Patient Instructions (Signed)
Medication Instructions:  Your physician recommends that you continue on your current medications as directed. Please refer to the Current Medication list given to you today.  *If you need a refill on your cardiac medications before your next appointment, please call your pharmacy*  Lab Work: None Ordered   Testing/Procedures: None Ordered    Follow-Up: At [facility], you and your health needs are our priority.  As part of our continuing mission to provide you with exceptional heart care, we have created designated Provider Care Teams.  These Care Teams include your primary Cardiologist (physician) and Advanced Practice Providers (APPs -  Physician Assistants and Nurse Practitioners) who all work together to provide you with the care you need, when you need it.  Your next appointment:   12 months  The format for your next appointment:   Either In Person or Virtual  Provider:   You may see Fransico Him, MD or one of the following Advanced Practice Providers on your designated Care Team:    Melina Copa, PA-C  Ermalinda Barrios, PA-C

## 2019-09-08 NOTE — Patient Instructions (Addendum)
DUE TO COVID-19 ONLY ONE VISITOR IS ALLOWED TO COME WITH YOU AND STAY IN THE WAITING ROOM ONLY DURING PRE OP AND PROCEDURE DAY OF SURGERY. THE 1 VISITOR MAY VISIT WITH YOU AFTER SURGERY IN YOUR PRIVATE ROOM DURING VISITING HOURS ONLY!  YOU NEED TO HAVE A COVID 19 TEST ON__10/23_____ @__8 :30_____, THIS TEST MUST BE DONE BEFORE SURGERY, COME  [address], [place] , [address].  ([place]) ONCE YOUR COVID TEST IS COMPLETED, PLEASE BEGIN THE QUARANTINE INSTRUCTIONS AS OUTLINED IN YOUR HANDOUT.                Scott Newton    Your procedure is scheduled on: 09/15/19   Report to [facility] Main  Entrance   Report to admitting at  7:00 AM     Call this number if you have problems the morning of surgery [phone]    Remember: Do not eat food or drink liquids :After Midnight  . BRUSH YOUR TEETH MORNING OF SURGERY AND RINSE YOUR MOUTH OUT, NO CHEWING GUM CANDY OR MINTS.     Take these medicines the morning of surgery with A SIP OF WATER:  Metoprolol                                 You may not have any metal on your body including piercings             Do not wear jewelry, lotions, powders or  deodorant                       Men may shave face and neck.   Do not bring valuables to the hospital. [place].  Contacts, dentures or bridgework may not be worn into surgery.       Patients discharged the day of surgery will not be allowed to drive home.   IF YOU ARE HAVING SURGERY AND GOING HOME THE SAME DAY, YOU MUST HAVE AN ADULT TO DRIVE YOU HOME AND BE WITH YOU FOR 24 HOURS.   YOU MAY GO HOME BY TAXI OR UBER OR ORTHERWISE, BUT AN ADULT MUST ACCOMPANY YOU HOME AND STAY WITH YOU FOR 24 HOURS.  Name and phone number of your driver:  Special Instructions: N/A              Please read over the following fact sheets you were  given: _____________________________________________________________________             [facility] - Preparing for Surgery  Before surgery, you can play an important role.   Because skin is not sterile, your skin needs to be as free of germs as possible.   You can reduce the number of germs on your skin by washing with CHG (chlorahexidine gluconate) soap before surgery.   CHG is an antiseptic cleaner which kills germs and bonds with the skin to continue killing germs even after washing. Please DO NOT use if you have an allergy to CHG or antibacterial soaps.   If your skin becomes reddened/irritated stop using the CHG and inform your nurse when you arrive at Short Stay.   You may shave your face/neck. Please follow these instructions carefully:   1.  Shower with CHG Soap the night before surgery and the  morning of  Surgery.  2.  If you choose to wash your hair, wash your hair first as usual with your  normal  shampoo.  3.  After you shampoo, rinse your hair and body thoroughly to remove the  shampoo.                                        4.  Use CHG as you would any other liquid soap.  You can apply chg directly  to the skin and wash                       Gently with a scrungie or clean washcloth.  5.  Apply the CHG Soap to your body ONLY FROM THE NECK DOWN.   Do not use on face/ open                           Wound or open sores. Avoid contact with eyes, ears mouth and genitals (private parts).                       Wash face,  Genitals (private parts) with your normal soap.             6.  Wash thoroughly, paying special attention to the area where your surgery  will be performed.  7.  Thoroughly rinse your body with warm water from the neck down.  8.  DO NOT shower/wash with your normal soap after using and rinsing off  the CHG Soap.                9.  Pat yourself dry with a clean towel.            10.  Wear clean pajamas.            11.  Place clean sheets on your bed the night  of your first shower and do not  sleep with pets. Day of Surgery : Do not apply any lotions/deodorants the morning of surgery.  Please wear clean clothes to the hospital/surgery center.  FAILURE TO FOLLOW THESE INSTRUCTIONS MAY RESULT IN THE CANCELLATION OF YOUR SURGERY PATIENT SIGNATURE_________________________________  NURSE SIGNATURE__________________________________  ________________________________________________________________________

## 2019-09-09 ENCOUNTER — Encounter (HOSPITAL_COMMUNITY)
Admission: RE | Admit: 2019-09-09 | Discharge: 2019-09-09 | Disposition: A | Discharge status: Home / Self Care | Payer: PPO | Source: Ambulatory Visit | Attending: Urology | Admitting: Urology

## 2019-09-09 ENCOUNTER — Other Ambulatory Visit: Payer: Self-pay

## 2019-09-09 ENCOUNTER — Encounter (HOSPITAL_COMMUNITY): Payer: Self-pay

## 2019-09-09 DIAGNOSIS — Z79899 Other long term (current) drug therapy: Secondary | ICD-10-CM | POA: Diagnosis not present

## 2019-09-09 DIAGNOSIS — I4821 Permanent atrial fibrillation: Secondary | ICD-10-CM | POA: Insufficient documentation

## 2019-09-09 DIAGNOSIS — Z7901 Long term (current) use of anticoagulants: Secondary | ICD-10-CM | POA: Diagnosis not present

## 2019-09-09 DIAGNOSIS — Z01812 Encounter for preprocedural laboratory examination: Secondary | ICD-10-CM | POA: Insufficient documentation

## 2019-09-09 DIAGNOSIS — F17210 Nicotine dependence, cigarettes, uncomplicated: Secondary | ICD-10-CM | POA: Insufficient documentation

## 2019-09-09 DIAGNOSIS — I10 Essential (primary) hypertension: Secondary | ICD-10-CM | POA: Diagnosis not present

## 2019-09-09 LAB — BASIC METABOLIC PANEL
Anion gap: 9 (ref 5–15)
BUN: 20 mg/dL (ref 8–23)
CO2: 25 mmol/L (ref 22–32)
Calcium: 8.7 mg/dL — ABNORMAL LOW (ref 8.9–10.3)
Chloride: 103 mmol/L (ref 98–111)
Creatinine, Ser: 0.99 mg/dL (ref 0.61–1.24)
GFR calc Af Amer: >60 mL/min (ref >60)
GFR calc non Af Amer: >60 mL/min (ref >60)
Glucose, Bld: 95 mg/dL (ref 70–99)
Potassium: 4.6 mmol/L (ref 3.5–5.1)
Sodium: 137 mmol/L (ref 135–145)

## 2019-09-09 LAB — CBC
HCT: 45.7 % (ref 39.0–52.0)
Hemoglobin: 14.5 g/dL (ref 13.0–17.0)
MCH: 29.6 pg (ref 26.0–34.0)
MCHC: 31.7 g/dL (ref 30.0–36.0)
MCV: 93.3 fL (ref 80.0–100.0)
Platelets: 201 10*3/uL (ref 150–400)
RBC: 4.9 MIL/uL (ref 4.22–5.81)
RDW: 12.2 % (ref 11.5–15.5)
WBC: 4.5 10*3/uL (ref 4.0–10.5)
nRBC: 0 % (ref 0.0–0.2)

## 2019-09-09 NOTE — Progress Notes (Signed)
PCP - Dr. Leia Alf Cardiologist - Dr. Ashok Norris  Chest x-ray - no EKG - 09/07/19 Stress Test - no ECHO - no Cardiac Cath - no  Sleep Study - no CPAP -   Fasting Blood Sugar - NA Checks Blood Sugar _____ times a day  Blood Thinner Instructions:Eliquis Aspirin Instructions:stop 3 days prior Last Dose:09/11/19  Anesthesia review:   Patient denies shortness of breath, fever, cough and chest pain at PAT appointment yes  Patient verbalized understanding of instructions that were given to them at the PAT appointment. Patient was also instructed that they will need to review over the PAT instructions again at home before surgery. yes

## 2019-09-11 ENCOUNTER — Other Ambulatory Visit (HOSPITAL_COMMUNITY)
Admission: RE | Admit: 2019-09-11 | Discharge: 2019-09-11 | Disposition: A | Discharge status: Home / Self Care | Payer: PPO | Source: Ambulatory Visit | Attending: Urology | Admitting: Urology

## 2019-09-11 DIAGNOSIS — Z20828 Contact with and (suspected) exposure to other viral communicable diseases: Secondary | ICD-10-CM | POA: Insufficient documentation

## 2019-09-11 DIAGNOSIS — Z01812 Encounter for preprocedural laboratory examination: Secondary | ICD-10-CM | POA: Diagnosis not present

## 2019-09-12 LAB — NOVEL CORONAVIRUS, NAA (HOSP ORDER, SEND-OUT TO REF LAB; TAT 18-24 HRS): SARS-CoV-2, NAA: NOT DETECTED

## 2019-09-15 ENCOUNTER — Ambulatory Visit (HOSPITAL_COMMUNITY): Payer: PPO

## 2019-09-15 ENCOUNTER — Encounter (HOSPITAL_COMMUNITY)
Admission: RE | Disposition: A | Discharge status: Home / Self Care | Payer: Self-pay | Source: Home / Self Care | Attending: Urology

## 2019-09-15 ENCOUNTER — Ambulatory Visit (HOSPITAL_COMMUNITY): Payer: PPO | Admitting: Anesthesiology

## 2019-09-15 ENCOUNTER — Encounter (HOSPITAL_COMMUNITY): Payer: Self-pay | Admitting: Anesthesiology

## 2019-09-15 ENCOUNTER — Ambulatory Visit (HOSPITAL_COMMUNITY)
Admission: RE | Admit: 2019-09-15 | Discharge: 2019-09-15 | Disposition: A | Discharge status: Home / Self Care | Payer: PPO | Attending: Urology | Admitting: Urology

## 2019-09-15 ENCOUNTER — Other Ambulatory Visit: Payer: Self-pay

## 2019-09-15 DIAGNOSIS — I10 Essential (primary) hypertension: Secondary | ICD-10-CM | POA: Diagnosis not present

## 2019-09-15 DIAGNOSIS — N529 Male erectile dysfunction, unspecified: Secondary | ICD-10-CM | POA: Insufficient documentation

## 2019-09-15 DIAGNOSIS — I482 Chronic atrial fibrillation, unspecified: Secondary | ICD-10-CM | POA: Insufficient documentation

## 2019-09-15 DIAGNOSIS — N400 Enlarged prostate without lower urinary tract symptoms: Secondary | ICD-10-CM | POA: Insufficient documentation

## 2019-09-15 DIAGNOSIS — N401 Enlarged prostate with lower urinary tract symptoms: Secondary | ICD-10-CM | POA: Insufficient documentation

## 2019-09-15 DIAGNOSIS — N3289 Other specified disorders of bladder: Secondary | ICD-10-CM | POA: Diagnosis not present

## 2019-09-15 DIAGNOSIS — F17290 Nicotine dependence, other tobacco product, uncomplicated: Secondary | ICD-10-CM | POA: Diagnosis not present

## 2019-09-15 DIAGNOSIS — N323 Diverticulum of bladder: Secondary | ICD-10-CM | POA: Insufficient documentation

## 2019-09-15 DIAGNOSIS — N138 Other obstructive and reflux uropathy: Secondary | ICD-10-CM | POA: Insufficient documentation

## 2019-09-15 DIAGNOSIS — Z7901 Long term (current) use of anticoagulants: Secondary | ICD-10-CM | POA: Diagnosis not present

## 2019-09-15 DIAGNOSIS — R310 Gross hematuria: Secondary | ICD-10-CM | POA: Diagnosis not present

## 2019-09-15 DIAGNOSIS — F17210 Nicotine dependence, cigarettes, uncomplicated: Secondary | ICD-10-CM | POA: Diagnosis not present

## 2019-09-15 DIAGNOSIS — I781 Nevus, non-neoplastic: Secondary | ICD-10-CM | POA: Insufficient documentation

## 2019-09-15 DIAGNOSIS — Z79899 Other long term (current) drug therapy: Secondary | ICD-10-CM | POA: Diagnosis not present

## 2019-09-15 HISTORY — PX: CYSTOSCOPY WITH FULGERATION: SHX6638

## 2019-09-15 SURGERY — CYSTOSCOPY, WITH BLADDER FULGURATION
Anesthesia: General

## 2019-09-15 MED ORDER — SODIUM CHLORIDE 0.9 % IR SOLN
Status: DC | PRN
  Administered 2019-09-15: 3000 mL via INTRAVESICAL

## 2019-09-15 MED ORDER — APIXABAN 5 MG PO TABS: 5.0000 mg | ORAL_TABLET | Freq: Two times a day (BID) | ORAL | 0 refills | Status: DC

## 2019-09-15 MED ORDER — FENTANYL CITRATE (PF) 100 MCG/2ML IJ SOLN
INTRAMUSCULAR | Status: AC
  Filled 2019-09-15: qty 2

## 2019-09-15 MED ORDER — ONDANSETRON HCL 4 MG/2ML IJ SOLN
INTRAMUSCULAR | Status: DC | PRN
  Administered 2019-09-15: 4 mg via INTRAVENOUS

## 2019-09-15 MED ORDER — PROPOFOL 10 MG/ML IV BOLUS
INTRAVENOUS | Status: DC | PRN
  Administered 2019-09-15: 150 mg via INTRAVENOUS

## 2019-09-15 MED ORDER — PROPOFOL 10 MG/ML IV BOLUS
INTRAVENOUS | Status: AC
  Filled 2019-09-15: qty 20

## 2019-09-15 MED ORDER — PHENYLEPHRINE 40 MCG/ML (10ML) SYRINGE FOR IV PUSH (FOR BLOOD PRESSURE SUPPORT)
PREFILLED_SYRINGE | INTRAVENOUS | Status: DC | PRN
  Administered 2019-09-15 (×2): 40 ug via INTRAVENOUS

## 2019-09-15 MED ORDER — MIDAZOLAM HCL 2 MG/2ML IJ SOLN
INTRAMUSCULAR | Status: AC
  Filled 2019-09-15: qty 2

## 2019-09-15 MED ORDER — ONDANSETRON HCL 4 MG/2ML IJ SOLN: 4.0000 mg | Freq: Once | INTRAMUSCULAR | Status: DC | PRN

## 2019-09-15 MED ORDER — STERILE WATER FOR IRRIGATION IR SOLN
Status: DC | PRN
  Administered 2019-09-15: 3000 mL

## 2019-09-15 MED ORDER — FENTANYL CITRATE (PF) 100 MCG/2ML IJ SOLN
INTRAMUSCULAR | Status: DC | PRN
  Administered 2019-09-15: 100 ug via INTRAVENOUS

## 2019-09-15 MED ORDER — SODIUM CHLORIDE 0.9 % IV SOLN
INTRAVENOUS | Status: DC | PRN
  Administered 2019-09-15: 14 mL

## 2019-09-15 MED ORDER — MIDAZOLAM HCL 5 MG/5ML IJ SOLN
INTRAMUSCULAR | Status: DC | PRN
  Administered 2019-09-15: 2 mg via INTRAVENOUS

## 2019-09-15 MED ORDER — HYDROMORPHONE HCL 1 MG/ML IJ SOLN: 0.2500 mg | INTRAMUSCULAR | Status: DC | PRN

## 2019-09-15 MED ORDER — DEXAMETHASONE SODIUM PHOSPHATE 4 MG/ML IJ SOLN
INTRAMUSCULAR | Status: DC | PRN
  Administered 2019-09-15: 4 mg via INTRAVENOUS

## 2019-09-15 MED ORDER — CEFAZOLIN SODIUM-DEXTROSE 2-4 GM/100ML-% IV SOLN
2.0000 g | Freq: Once | INTRAVENOUS | Status: AC
  Administered 2019-09-15: 2 g via INTRAVENOUS
  Filled 2019-09-15: qty 100

## 2019-09-15 MED ORDER — LIDOCAINE HCL (CARDIAC) PF 100 MG/5ML IV SOSY
PREFILLED_SYRINGE | INTRAVENOUS | Status: DC | PRN
  Administered 2019-09-15: 100 mg via INTRAVENOUS

## 2019-09-15 MED ORDER — PHENYLEPHRINE 40 MCG/ML (10ML) SYRINGE FOR IV PUSH (FOR BLOOD PRESSURE SUPPORT)
PREFILLED_SYRINGE | INTRAVENOUS | Status: AC
  Filled 2019-09-15: qty 10

## 2019-09-15 MED ORDER — MEPERIDINE HCL 50 MG/ML IJ SOLN: 6.2500 mg | INTRAMUSCULAR | Status: DC | PRN

## 2019-09-15 MED ORDER — LACTATED RINGERS IV SOLN
INTRAVENOUS | Status: DC
  Administered 2019-09-15: 07:00:00 via INTRAVENOUS

## 2019-09-15 SURGICAL SUPPLY — 16 items
BAG URINE DRAINAGE (UROLOGICAL SUPPLIES) ×2 IMPLANT
BAG URO CATCHER STRL LF (MISCELLANEOUS) ×3 IMPLANT
CATH FOLEY 2WAY SLVR  5CC 18FR (CATHETERS) ×2
CATH FOLEY 2WAY SLVR 5CC 18FR (CATHETERS) IMPLANT
CATH URET 5FR 28IN CONE TIP (BALLOONS) ×2
CATH URET 5FR 70CM CONE TIP (BALLOONS) IMPLANT
GLOVE BIOGEL M STRL SZ7.5 (GLOVE) ×3 IMPLANT
GOWN STRL REUS W/TWL XL LVL3 (GOWN DISPOSABLE) ×3 IMPLANT
GUIDEWIRE STR DUAL SENSOR (WIRE) ×2 IMPLANT
KIT TURNOVER KIT A (KITS) IMPLANT
LOOP CUT BIPOLAR 24F LRG (ELECTROSURGICAL) IMPLANT
MANIFOLD NEPTUNE II (INSTRUMENTS) ×3 IMPLANT
PACK CYSTO (CUSTOM PROCEDURE TRAY) ×3 IMPLANT
TUBING CONNECTING 10 (TUBING) ×2 IMPLANT
TUBING CONNECTING 10' (TUBING) ×1
TUBING UROLOGY SET (TUBING) ×3 IMPLANT

## 2019-09-15 NOTE — H&P (Signed)
H&P  Chief Complaint: Gross hematuria, BPH  History of Present Illness: Mr. Scott Newton is a 73 year old male with a history of BPH and bladder diverticulum.  He underwent a UroLift prostate implant April 2019.  He had gross hematuria April 2020 and office cystoscopy revealed some erythema in the diverticulum which did not appear worrisome.  He had a prior biopsy which was benign.  Also ultrasound April 2020 was benign.  He continues to have gross hematuria after he works in the yard or plays golf.  He has red urine.  After he stopped Eliquis the other day passed a small clot.  He has had no dysuria or fever.  Past Medical History:  Diagnosis Date  . Anticoagulant long-term use    eliquis  . BPH with obstruction/lower urinary tract symptoms   . Chronic atrial fibrillation [facility]) cardiologist-  dr Tressia Miners turner   dx 10/ 2016---  s/p DCCV with reversion back to afib now pursuing rate control. 10-19-2015  . Dysrhythmia 2015   Afib  . ED (erectile dysfunction)   . Essential hypertension   . Full dentures   . Gross hematuria   . Peyronie's disease   . Umbilical hernia   . Weak urinary stream   . Wears glasses    Past Surgical History:  Procedure Laterality Date  . CARDIOVERSION N/A 10/19/2015   Procedure: CARDIOVERSION;  Surgeon: Josue Hector, MD;  Location: [facility];  Service: Cardiovascular;  Laterality: N/A;  . CYSTOSCOPY WITH FULGERATION N/A 03/18/2018   Procedure: CYSTOSCOPY WITH FULGERATION/ BLADDER BIOPSY;  Surgeon: Festus Aloe, MD;  Location: [facility];  Service: Urology;  Laterality: N/A;  . CYSTOSCOPY WITH INSERTION OF UROLIFT N/A 03/18/2018   Procedure: CYSTOSCOPY WITH INSERTION OF UROLIFT;  Surgeon: Festus Aloe, MD;  Location: [facility];  Service: Urology;  Laterality: N/A;  . KNEE ARTHROSCOPY Left 2009  approx.  . TONSILLECTOMY  child  . TRANSTHORACIC ECHOCARDIOGRAM  09-27-2015  dr Tressia Miners turner   mild focal basal hypertrophy of the  septum,  ef 60-65%/  trivial AR/  mild MR/  severe LAE/      Home Medications:  Medications Prior to Admission  Medication Sig Dispense Refill Last Dose  . acetaminophen (TYLENOL) 500 MG tablet Take 500 mg by mouth every 6 (six) hours as needed for moderate pain or headache.   09/14/2019 at Unknown time  . apixaban (ELIQUIS) 5 MG TABS tablet Take 1 tablet (5 mg total) by mouth 2 (two) times daily. 180 tablet 0 09/11/2019  . metoprolol succinate (TOPROL-XL) 50 MG 24 hr tablet Take 1 1/2 (75 mg total) tablet by mouth daily.Take with or immediately following a meal. 135 tablet 2 09/15/2019 at 0530  . Multiple Vitamins-Minerals (MULTIVITAMIN WITH MINERALS) tablet Take 1 tablet by mouth 2 (two) times a week.    Past Week at Unknown time  . sildenafil (REVATIO) 20 MG tablet Take 20-100 mg by mouth daily as needed for erectile dysfunction.   Past Month at Unknown time   Allergies: No Known Allergies  Family History  Problem Relation Age of Onset  . Heart disease Father 36  . Heart attack Father 41  . Hypertension Mother 51  . Heart attack Brother 65  . Stroke Neg Hx    Social History:  reports that he has been smoking cigars and cigarettes. He has smoked for the past 20.00 years. He has never used smokeless tobacco. He reports current alcohol use of about 14.0 standard drinks of alcohol  per week. He reports that he does not use drugs.  ROS: A complete review of systems was performed.  All systems are negative except for pertinent findings as noted. Review of Systems  All other systems reviewed and are negative.    Physical Exam:  Vital signs in last 24 hours: Temp:  [98 F (36.7 C)] 98 F (36.7 C) (10/27 0700) Pulse Rate:  [70] 70 (10/27 0700) Resp:  [18] 18 (10/27 0700) BP: (150)/(97) 150/97 (10/27 0700) SpO2:  [99 %] 99 % (10/27 0700) General:  Alert and oriented, No acute distress HEENT: Normocephalic, atraumatic Cardiovascular: Regular rate and rhythm Lungs: Regular rate and  effort Abdomen: Soft, nontender, nondistended, no abdominal masses Back: No CVA tenderness Extremities: No edema Neurologic: Grossly intact  Laboratory Data:  No results found for this or any previous visit (from the past 24 hour(s)). Recent Results (from the past 240 hour(s))  Novel Coronavirus, NAA (Hosp order, Send-out to Ref Lab; TAT 18-24 hrs     Status: None   Collection Time: 09/11/19  8:56 AM   Specimen: Nasopharyngeal Swab; Respiratory  Result Value Ref Range Status   SARS-CoV-2, NAA NOT DETECTED NOT DETECTED Final    Comment: (NOTE) This nucleic acid amplification test was developed and its performance characteristics determined by [employer]. Nucleic acid amplification tests include PCR and TMA. This test has not been FDA cleared or approved. This test has been authorized by FDA under an Emergency Use Authorization (EUA). This test is only authorized for the duration of time the declaration that circumstances exist justifying the authorization of the emergency use of in vitro diagnostic tests for detection of SARS-CoV-2 virus and/or diagnosis of COVID-19 infection under section 564(b)(1) of the Act, 21 U.S.C. PT:2852782) (1), unless the authorization is terminated or revoked sooner. When diagnostic testing is negative, the possibility of a false negative result should be considered in the context of a patient's recent exposures and the presence of clinical signs and symptoms consistent with COVID-19. An individual without symptoms of COVID- 19 and who is not shedding SARS-CoV-2 vi rus would expect to have a negative (not detected) result in this assay. Performed At: [facility] [address] [place], [place] [id number] Rush Farmer MD [id number]    [place]  Final    Comment: Performed at [facility], [place] [address], [place], [place] [address]   Creatinine: Recent Labs    09/09/19 0932  CREATININE 0.99     Impression/Assessment:  BPH, gross hematuria, bladder diverticulum-  Plan:  I discussed with the patient the nature, potential benefits, risks and alternatives to *cystoscopy with bladder biopsy and fulguration, bilateral retrograde pyelogram, including side effects of the proposed treatment, the likelihood of the patient achieving the goals of the procedure, and any potential problems that might occur during the procedure or recuperation. All questions answered. Patient elects to proceed.    Festus Aloe 09/15/2019, 9:59 AM

## 2019-09-15 NOTE — Anesthesia Procedure Notes (Signed)
Procedure Name: LMA Insertion Date/Time: 09/15/2019 10:14 AM Performed by: Lavina Hamman, CRNA Pre-anesthesia Checklist: Patient identified, Emergency Drugs available, Suction available and Patient being monitored Patient Re-evaluated:Patient Re-evaluated prior to induction Oxygen Delivery Method: Circle System Utilized Preoxygenation: Pre-oxygenation with 100% oxygen Induction Type: IV induction Ventilation: Mask ventilation without difficulty LMA: LMA inserted LMA Size: 4.0 Number of attempts: 1 Airway Equipment and Method: Bite block Placement Confirmation: positive ETCO2 Tube secured with: Tape Dental Injury: Teeth and Oropharynx as per pre-operative assessment

## 2019-09-15 NOTE — Anesthesia Postprocedure Evaluation (Signed)
Anesthesia Post Note  Patient: Takao Granados. [place]  Procedure(s) Performed: CYSTOSCOPY WITH FULGERATION/BLADDER BIOPSY/ BILATERAL RETROGRADE (N/A )     Patient location during evaluation: Phase II Anesthesia Type: General Level of consciousness: awake Pain management: pain level controlled Vital Signs Assessment: post-procedure vital signs reviewed and stable Respiratory status: spontaneous breathing Cardiovascular status: stable Postop Assessment: no apparent nausea or vomiting Anesthetic complications: no    Last Vitals:  Vitals:   09/15/19 1145 09/15/19 1226  BP: (!) 136/99 130/90  Pulse: 71 67  Resp: 20 18  Temp: (!) 36.3 C 36.5 C  SpO2: 97% 100%    Last Pain:  Vitals:   09/15/19 1226  TempSrc: Oral  PainSc: 0-No pain   Pain Goal:                   Huston Foley

## 2019-09-15 NOTE — Anesthesia Preprocedure Evaluation (Signed)
Anesthesia Evaluation  Patient identified by MRN, date of birth, ID band Patient awake    Reviewed: Allergy & Precautions, NPO status , Patient's Chart, lab work & pertinent test results  Airway Mallampati: I  TM Distance: >3 FB Neck ROM: Full    Dental   Pulmonary Current Smoker and Patient abstained from smoking.,    Pulmonary exam normal        Cardiovascular hypertension, Pt. on medications Normal cardiovascular exam+ dysrhythmias Atrial Fibrillation      Neuro/Psych    GI/Hepatic   Endo/Other    Renal/GU      Musculoskeletal   Abdominal   Peds  Hematology   Anesthesia Other Findings   Reproductive/Obstetrics                             Anesthesia Physical Anesthesia Plan  ASA: III  Anesthesia Plan: General   Post-op Pain Management:    Induction: Intravenous  PONV Risk Score and Plan: 1 and Ondansetron  Airway Management Planned: LMA  Additional Equipment:   Intra-op Plan:   Post-operative Plan: Extubation in OR  Informed Consent: I have reviewed the patients History and Physical, chart, labs and discussed the procedure including the risks, benefits and alternatives for the proposed anesthesia with the patient or authorized representative who has indicated his/her understanding and acceptance.       Plan Discussed with: CRNA and Surgeon  Anesthesia Plan Comments:         Anesthesia Quick Evaluation

## 2019-09-15 NOTE — Op Note (Signed)
Preoperative diagnosis: Gross hematuria, BPH, bladder diverticulum, bladder erythema Postoperative diagnosis: Same  Procedure: Exam under anesthesia, cystoscopy with bladder biopsy and fulguration 2 to 5 cm, bilateral retrograde pyelogram  Surgeon: Junious Silk  Anesthesia: General  Indication for procedure: Scott Newton is a 73 year old male with a history of BPH, bladder diverticulum.  He continues to have some red urine after he does strenuous activity or plays golf.  He was brought today for above procedures.  Findings: On exam under anesthesia the penis is circumcised and without mass or lesions.  The scrotum is normal.  The testicles are descended bilaterally and palpably normal.  On digital rectal exam the prostate was about 50 g and smooth without hard area or nodule.  On cystoscopy the urethra was unremarkable, prostate nonobstructing with UroLift implants in good position.  No out of place implants or prostate or bladder neck stones.  The bladder neck is quite high.  There is some vascularity of the prostate and some mild oozing with scope passage.  The ureteral orifice ease and trigone appeared normal.  There was clear efflux bilaterally.  There was no stone or foreign body in the bladder.  There were no mucosal lesions.  There was a left diverticulum that was several centimeters in size.  There were 2 right posterior cellules.  In the left diverticulum there was some erythematous mucosa especially superior and lateral about the 2 o'clock position which appeared to have been oozing with some dependent blood in the diverticulum.  This was biopsied x2 and about 2/3 of the diverticulum fulgurated including all of the erythematous mucosa.  Hemostasis excellent at low pressure.  Left retrograde pyelogram-this outlined a single ureter single collecting system unit without filling defect, stricture or dilation.  Right retrograde pyelogram-this outlined a single ureter single collecting system unit  without filling defect, stricture or dilation.  Description of procedure: After consent was obtained patient brought to the operating room.  After adequate anesthesia was placed in lithotomy position and prepped and draped in the usual sterile fashion.  A timeout was performed to confirm the patient and procedure.  Exam under anesthesia was performed.  The cystoscope was passed per urethra and the bladder carefully inspected as well as the diverticulum with a 30 degree and 70 degree lens  The left ureteral orifice was cannulated with a 6 [place] open-ended catheter and left retrograde injection of contrast performed.  The right ureteral orifice would not cannulate and I used a cone-tip catheter to cannulate the right ureteral orifice and right retrograde injection of contrast performed.  There was excellent drainage bilaterally.  The left diverticulum was biopsied x2 in the erythematous area.  I then fulgurated all of the erythema and some surrounding area.  Over half of the diverticulum was fulgurated.  About 3 cm.  Hemostasis was excellent at low pressure.  Because I was torquing the scope to the left inside the diverticulum the prostatic urethra was oozing and I left a Foley catheter for wake up.  An 64 French Foley catheter was placed in left to gravity drainage.  Irrigation was clear.  He was then awakened taken recovery room in stable condition.  Complications: None  Blood loss: Minimal  Specimens to pathology: Left bladder diverticulum biopsy x2  Drains: 18 French Foley to be removed in PACU  Disposition: Patient stable to PACU

## 2019-09-15 NOTE — Discharge Instructions (Signed)
Bladder Biopsy, Care After °Refer to this sheet in the next few weeks. These instructions provide you with information about caring for yourself after your procedure. Your health care provider may also give you more specific instructions. Your treatment has been planned according to current medical practices, but problems sometimes occur. Call your health care provider if you have any problems or questions after your procedure. °What can I expect after the procedure? °After the procedure, it is common to have: °· Mild pain in your bladder or kidney area during urination. °· Minor burning during urination. °· Small amounts of blood in your urine. °· A sudden urge to urinate. °· A need to urinate more often than usual. °Follow these instructions at home: °Medicines °· Take over-the-counter and prescription medicines only as told by your health care provider. °· If you were prescribed an antibiotic medicine, take it as told by your health care provider. Do not stop taking the antibiotic even if you start to feel better. °General instructions ° °· Take a warm bath to relieve any burning sensations around your urethra. °· Hold a warm, damp washcloth over the urethral area to ease pain. °· Return to your normal activities as told by your health care provider. Ask your health care provider what activities are safe for you. °· Do not drive for 24 hours if you received a medicine to help you relax (sedative) during your procedure. Ask your health care provider when it is safe for you to drive. °· It is your responsibility to get the results of your procedure. Ask your health care provider or the department performing the procedure when your results will be ready. °· Keep all follow-up visits as told by your health care provider. This is important. °Contact a health care provider if: °· You have a fever. °· Your symptoms do not improve within 24 hours and you continue to have: °? Burning during urination. °? Increasing  amounts of blood in your urine. °? Pain during urination. °? An urgent need to urinate. °? A need to urinate more often than usual. °Get help right away if: °· You have a lot of bleeding or more bleeding. °· You have severe pain. °· You are unable to urinate. °· You have bright red blood in your urine. °· You are passing blood clots in your urine. °· You have a fever. °· You have swelling, redness, or pain in your legs. °· You have difficulty breathing. °This information is not intended to replace advice given to you by your health care provider. Make sure you discuss any questions you have with your health care provider. °Document Released: 11/22/2015 Document Revised: 04/12/2016 Document Reviewed: 11/22/2015 °Elsevier Patient Education © 2020 [employer] ° °

## 2019-09-15 NOTE — Transfer of Care (Signed)
Immediate Anesthesia Transfer of Care Note  Patient: Scott Newton. [place]  Procedure(s) Performed: Procedure(s): CYSTOSCOPY WITH FULGERATION/BLADDER BIOPSY/ BILATERAL RETROGRADE (N/A)  Patient Location: PACU  Anesthesia Type:General  Level of Consciousness:  sedated, patient cooperative and responds to stimulation  Airway & Oxygen Therapy:Patient Spontanous Breathing and Patient connected to face mask oxgen  Post-op Assessment:  Report given to PACU RN and Post -op Vital signs reviewed and stable  Post vital signs:  Reviewed and stable  Last Vitals:  Vitals:   09/15/19 0700 09/15/19 1112  BP: (!) 150/97   Pulse: 70   Resp: 18   Temp: 36.7 C (!) (P) 36.4 C  SpO2: 123456     Complications: No apparent anesthesia complications

## 2019-09-16 ENCOUNTER — Encounter (HOSPITAL_COMMUNITY): Payer: Self-pay | Admitting: Urology

## 2019-09-16 LAB — SURGICAL PATHOLOGY

## 2019-10-13 ENCOUNTER — Telehealth: Payer: Self-pay | Admitting: Cardiology

## 2019-10-13 NOTE — Telephone Encounter (Signed)
**Note De-Identified Tamella Tuccillo Obfuscation** The pt states that he will contact [employer] pt asst to see if he qualifies for asst through them this year for his Eliquis. He is concerned that his income may exceed their limit.  He states that he is almost out of Eliquis at this time.  He is advised that we will leave him 2 bottles of Eliquis samples at our Covid-19 screening table in the downstairs lobby of Dr Landis Gandy office in [place] for him to pick up.  He verbalized understanding and thanked me for my assistance.

## 2019-10-13 NOTE — Telephone Encounter (Signed)
Scott Hough, LPN, pt calling stating that he is in the donut hole and would like to get some samples. Can you please advise on this matter? Thank you

## 2019-11-19 ENCOUNTER — Telehealth: Payer: Self-pay | Admitting: Cardiology

## 2019-11-19 NOTE — Telephone Encounter (Signed)
Patient calling the office for samples of medication:   1.  What medication and dosage are you requesting samples for? apixaban (ELIQUIS) 5 MG TABS tablet  2.  Are you currently out of this medication? Patient is in the donut hole. He is currently out of medication

## 2019-11-19 NOTE — Telephone Encounter (Signed)
I called and spoke with patient, he states that he found 3 extra tablets, he does not need samples of Eliquis, he will pick refill up tomorrow or Saturday.

## 2019-12-09 DIAGNOSIS — R310 Gross hematuria: Secondary | ICD-10-CM | POA: Diagnosis not present

## 2019-12-09 DIAGNOSIS — R350 Frequency of micturition: Secondary | ICD-10-CM | POA: Diagnosis not present

## 2019-12-09 DIAGNOSIS — N323 Diverticulum of bladder: Secondary | ICD-10-CM | POA: Diagnosis not present

## 2019-12-09 DIAGNOSIS — N401 Enlarged prostate with lower urinary tract symptoms: Secondary | ICD-10-CM | POA: Diagnosis not present

## 2020-02-04 DIAGNOSIS — I10 Essential (primary) hypertension: Secondary | ICD-10-CM | POA: Diagnosis not present

## 2020-02-04 DIAGNOSIS — I4891 Unspecified atrial fibrillation: Secondary | ICD-10-CM | POA: Diagnosis not present

## 2020-02-04 DIAGNOSIS — R42 Dizziness and giddiness: Secondary | ICD-10-CM | POA: Diagnosis not present

## 2020-02-09 ENCOUNTER — Other Ambulatory Visit: Payer: Self-pay | Admitting: Cardiology

## 2020-02-09 NOTE — Telephone Encounter (Signed)
Eliquis 5mg  refill request received. Pt is 74 years old, weight-79kg, Crea-0.99 on 09/09/2019, Diagnosis-Afib, and last seen by Dr. Radford Pax on 09/07/2019. Dose is appropriate based on dosing criteria. Will send in refill to requested pharmacy.

## 2020-02-15 DIAGNOSIS — I4891 Unspecified atrial fibrillation: Secondary | ICD-10-CM | POA: Diagnosis not present

## 2020-02-15 DIAGNOSIS — I10 Essential (primary) hypertension: Secondary | ICD-10-CM | POA: Diagnosis not present

## 2020-02-15 DIAGNOSIS — Z0000 Encounter for general adult medical examination without abnormal findings: Secondary | ICD-10-CM | POA: Diagnosis not present

## 2020-04-21 ENCOUNTER — Other Ambulatory Visit: Payer: Self-pay | Admitting: Cardiology

## 2020-05-24 DIAGNOSIS — Z0100 Encounter for examination of eyes and vision without abnormal findings: Secondary | ICD-10-CM | POA: Diagnosis not present

## 2020-06-01 DIAGNOSIS — N401 Enlarged prostate with lower urinary tract symptoms: Secondary | ICD-10-CM | POA: Diagnosis not present

## 2020-06-08 DIAGNOSIS — R3915 Urgency of urination: Secondary | ICD-10-CM | POA: Diagnosis not present

## 2020-06-08 DIAGNOSIS — N401 Enlarged prostate with lower urinary tract symptoms: Secondary | ICD-10-CM | POA: Diagnosis not present

## 2020-06-27 DIAGNOSIS — T63441A Toxic effect of venom of bees, accidental (unintentional), initial encounter: Secondary | ICD-10-CM | POA: Diagnosis not present

## 2020-07-29 ENCOUNTER — Other Ambulatory Visit: Payer: Self-pay | Admitting: Cardiology

## 2020-07-29 NOTE — Telephone Encounter (Signed)
Prescription refill request for Eliquis received.  Last office visit: Turner, 09/07/2019 Scr: 0.99, 09/09/2019 Age: 74 y.o. Weight: 79 kg   Prescription refill sent.

## 2020-07-29 NOTE — Telephone Encounter (Signed)
19m 79.8kg Scr 0.99 09/09/19 Lovw/turner 09/06/20

## 2020-09-06 ENCOUNTER — Encounter: Payer: Self-pay | Admitting: Cardiology

## 2020-09-06 ENCOUNTER — Ambulatory Visit: Payer: PPO | Admitting: Cardiology

## 2020-09-06 ENCOUNTER — Other Ambulatory Visit: Payer: Self-pay

## 2020-09-06 VITALS — BP 154/90 | HR 80 | Ht 68.0 in | Wt 180.8 lb

## 2020-09-06 DIAGNOSIS — I482 Chronic atrial fibrillation, unspecified: Secondary | ICD-10-CM | POA: Diagnosis not present

## 2020-09-06 DIAGNOSIS — I10 Essential (primary) hypertension: Secondary | ICD-10-CM

## 2020-09-06 LAB — CBC

## 2020-09-06 MED ORDER — METOPROLOL SUCCINATE ER 50 MG PO TB24: ORAL_TABLET | ORAL | 3 refills | Status: DC

## 2020-09-06 NOTE — Progress Notes (Signed)
Cardiology Office Note:    Date:  09/06/2020   ID:  Scott Newton, Scott Newton 1946-01-07, MRN [id number]  PCP:  Scott Pepper, MD  Cardiologist:  Fransico Him, MD    Referring MD: Scott Pepper, MD   Chief Complaint  Patient presents with  . Atrial Fibrillation  . Hypertension    History of Present Illness:    Scott Newton is a 74 y.o. male with a hx of HTN, permanentatrial fibrillationwith subsequent andDCCV andreccurrent afib andwas asymptomaticandopted for rate control. 2D echo 11/16 showed normal LVEF and wall motion. EF was 60-65%. He is on chronic Eliquis for CHADS2VASC score of 2.    He is here today for followup and is doing well.  He denies any chest pain or pressure, SOB, DOE, PND, orthopnea, LE edema, dizziness, palpitations or syncope. He is compliant with his meds and is tolerating meds with no SE.    Past Medical History:  Diagnosis Date  . Anticoagulant long-term use    eliquis  . BPH with obstruction/lower urinary tract symptoms   . Chronic atrial fibrillation [facility]) cardiologist-  dr Tressia Miners Amedee Cerrone   dx 10/ 2016---  s/p DCCV with reversion back to afib now pursuing rate control. 10-19-2015  . Dysrhythmia 2015   Afib  . ED (erectile dysfunction)   . Essential hypertension   . Full dentures   . Gross hematuria   . Peyronie's disease   . Umbilical hernia   . Weak urinary stream   . Wears glasses     Past Surgical History:  Procedure Laterality Date  . CARDIOVERSION N/A 10/19/2015   Procedure: CARDIOVERSION;  Surgeon: Josue Hector, MD;  Location: [facility] ENDOSCOPY;  Service: Cardiovascular;  Laterality: N/A;  . CYSTOSCOPY WITH FULGERATION N/A 03/18/2018   Procedure: CYSTOSCOPY WITH FULGERATION/ BLADDER BIOPSY;  Surgeon: Festus Aloe, MD;  Location: [facility];  Service: Urology;  Laterality: N/A;  . CYSTOSCOPY WITH FULGERATION N/A 09/15/2019   Procedure: CYSTOSCOPY WITH FULGERATION/BLADDER BIOPSY/ BILATERAL RETROGRADE;  Surgeon:  Festus Aloe, MD;  Location: [facility];  Service: Urology;  Laterality: N/A;  . CYSTOSCOPY WITH INSERTION OF UROLIFT N/A 03/18/2018   Procedure: CYSTOSCOPY WITH INSERTION OF UROLIFT;  Surgeon: Festus Aloe, MD;  Location[facility];  Service: Urology;  Laterality: N/A;  . KNEE ARTHROSCOPY Left 2009  approx.  . TONSILLECTOMY  child  . TRANSTHORACIC ECHOCARDIOGRAM  09-27-2015  dr Tressia Miners Scott Newton   mild focal basal hypertrophy of the septum,  ef 60-65%/  trivial AR/  mild MR/  severe LAE/      Current Medications: Current Meds  Medication Sig  . acetaminophen (TYLENOL) 500 MG tablet Take 500 mg by mouth every 6 (six) hours as needed for moderate pain or headache.  Marland Kitchen ELIQUIS 5 MG TABS tablet TAKE 1 TABLET BY MOUTH TWICE A DAY  . metoprolol succinate (TOPROL-XL) 50 MG 24 hr tablet TAKE 1&1/2 TABLET BY MOUTH DAILY.TAKE WITH OR IMMEDIATELY FOLLOWING A MEAL.  . Multiple Vitamins-Minerals (MULTIVITAMIN WITH MINERALS) tablet Take 1 tablet by mouth 2 (two) times a week.   . sildenafil (REVATIO) 20 MG tablet Take 20-100 mg by mouth daily as needed for erectile dysfunction.     Allergies:   Patient has no known allergies.   Social History   Socioeconomic History  . Marital status: Married    Spouse name: Not on file  . Number of children: 3  . Years of education: college  . Highest education level: Not on file  Occupational History  . Occupation: unemployed  Tobacco Use  . Smoking status: Current Some Day Smoker    Years: 20.00    Types: Cigars, Cigarettes  . Smokeless tobacco: Never Used  . Tobacco comment: 03-13-2018 per pt quit cigarettes 1990s, but currently smokes occasional cigar  Vaping Use  . Vaping Use: Never used  Substance and Sexual Activity  . Alcohol use: Yes    Alcohol/week: 14.0 standard drinks    Types: 14 Glasses of wine per week    Comment: average 2 wine daily  . Drug use: No  . Sexual activity: Not on file  Other Topics Concern  . Not on file   Social History Narrative  . Not on file   Social Determinants of Health   Financial Resource Strain:   . Difficulty of Paying Living Expenses: Not on file  Food Insecurity:   . Worried About Charity fundraiser in the Last Year: Not on file  . Ran Out of Food in the Last Year: Not on file  Transportation Needs:   . Lack of Transportation (Medical): Not on file  . Lack of Transportation (Non-Medical): Not on file  Physical Activity:   . Days of Exercise per Week: Not on file  . Minutes of Exercise per Session: Not on file  Stress:   . Feeling of Stress : Not on file  Social Connections:   . Frequency of Communication with Friends and Family: Not on file  . Frequency of Social Gatherings with Friends and Family: Not on file  . Attends Religious Services: Not on file  . Active Member of Clubs or Organizations: Not on file  . Attends Archivist Meetings: Not on file  . Marital Status: Not on file     Family History: The patient's family history includes Heart attack (age of onset: 61) in his father; Heart attack (age of onset: 31) in his brother; Heart disease (age of onset: 58) in his father; Hypertension (age of onset: 20) in his mother. There is no history of Stroke.  ROS:   Please see the history of present illness.    ROS  All other systems reviewed and negative.   EKGs/Labs/Other Studies Reviewed:    The following studies were reviewed today: none  EKG:  EKG is  ordered today.  The ekg ordered today demonstrates atrial fibrillation with IRBBB and no ST changes  Recent Labs: 09/09/2019: BUN 20; Creatinine, Ser 0.99; Hemoglobin 14.5; Platelets 201; Potassium 4.6; Sodium 137   Recent Lipid Panel No results found for: CHOL, TRIG, HDL, CHOLHDL, VLDL, LDLCALC, LDLDIRECT  Physical Exam:    VS:  BP (!) 154/90   Pulse 80   Ht 5\' 8"  (1.727 m)   Wt 180 lb 12.8 oz (82 kg)   SpO2 97%   BMI 27.49 kg/m     Wt Readings from Last 3 Encounters:  09/06/20 180 lb  12.8 oz (82 kg)  09/09/19 174 lb 1.6 oz (79 kg)  09/07/19 176 lb (79.8 kg)     GEN: Well nourished, well developed in no acute distress HEENT: Normal NECK: No JVD; No carotid bruits LYMPHATICS: No lymphadenopathy CARDIAC:irregularly irregular, no murmurs, rubs, gallops RESPIRATORY:  Clear to auscultation without rales, wheezing or rhonchi  ABDOMEN: Soft, non-tender, non-distended MUSCULOSKELETAL:  No edema; No deformity  SKIN: Warm and dry NEUROLOGIC:  Alert and oriented x 3 PSYCHIATRIC:  Normal affect    ASSESSMENT:    1. Chronic atrial fibrillation ([place])   2.  Essential hypertension    PLAN:    In order of problems listed above:  1.  Chronic atrial fibrillation -HR is well controlled on exam today -he denies any bleeding problems on DOAC -continue Toprol XL 25mg  daily and Eliquis 5mg  BID -check BMET and CBC  2.  HTN -BP borderline controlled on exam today -increaseToprol XL 75mg  qam and add 25mg  qpm -check BP daily for a week and call with the results   Medication Adjustments/Labs and Tests Ordered: Current medicines are reviewed at length with the patient today.  Concerns regarding medicines are outlined above.  Orders Placed This Encounter  Procedures  . EKG 12-Lead   No orders of the defined types were placed in this encounter.   Signed, Fransico Him, MD  09/06/2020 9:27 AM    [place]

## 2020-09-06 NOTE — Addendum Note (Signed)
Addended by: Antonieta Iba on: 09/06/2020 09:40 AM   Modules accepted: Orders

## 2020-09-06 NOTE — Patient Instructions (Signed)
Please check your blood pressure daily for the next week and send [place] a message or call us with a list of your readings.   Medication Instructions:  Your physician has recommended you make the following change in your medication: 1) INCREASE Toprol (metoprolol succinate) to 1.5 tablets (75 mg total) every morning and 0.5 tablet (25 mg total) every evening.   *If you need a refill on your cardiac medications before your next appointment, please call your pharmacy*  Lab Work: TODAY: CBC and BMET If you have labs (blood work) drawn today and your tests are completely normal, you will receive your results only by:  [place] (if you have MyChart) OR  A paper copy in the mail If you have any lab test that is abnormal or we need to change your treatment, we will call you to review the results.   Follow-Up: At [facility], you and your health needs are our priority.  As part of our continuing mission to provide you with exceptional heart care, we have created designated Provider Care Teams.  These Care Teams include your primary Cardiologist (physician) and Advanced Practice Providers (APPs -  Physician Assistants and Nurse Practitioners) who all work together to provide you with the care you need, when you need it.   Your next appointment:   1 year(s)  The format for your next appointment:   In Person  Provider:   You may see Fransico Him, MD or one of the following Advanced Practice Providers on your designated Care Team:    Melina Copa, PA-C  Ermalinda Barrios, PA-C

## 2020-09-07 LAB — CBC
Hematocrit: 48.1 % (ref 37.5–51.0)
Hemoglobin: 15.9 g/dL (ref 13.0–17.7)
MCH: 30.9 pg (ref 26.6–33.0)
MCHC: 33.1 g/dL (ref 31.5–35.7)
MCV: 93 fL (ref 79–97)
Platelets: 192 10*3/uL (ref 150–450)
RBC: 5.15 x10E6/uL (ref 4.14–5.80)
RDW: 12.5 % (ref 11.6–15.4)
WBC: 4.5 10*3/uL (ref 3.4–10.8)

## 2020-09-07 LAB — BASIC METABOLIC PANEL
BUN/Creatinine Ratio: 14 (ref 10–24)
BUN: 14 mg/dL (ref 8–27)
CO2: 25 mmol/L (ref 20–29)
Calcium: 9.1 mg/dL (ref 8.6–10.2)
Chloride: 101 mmol/L (ref 96–106)
Creatinine, Ser: 1.01 mg/dL (ref 0.76–1.27)
GFR calc Af Amer: 85 mL/min/{1.73_m2} (ref >59)
GFR calc non Af Amer: 73 mL/min/{1.73_m2} (ref >59)
Glucose: 96 mg/dL (ref 65–99)
Potassium: 4.2 mmol/L (ref 3.5–5.2)
Sodium: 139 mmol/L (ref 134–144)

## 2020-12-06 ENCOUNTER — Other Ambulatory Visit: Payer: Self-pay | Admitting: Cardiology

## 2020-12-06 NOTE — Telephone Encounter (Signed)
1m, 82kg, scr 1.01 09/06/20, lovw/turner 09/06/20

## 2021-03-22 DIAGNOSIS — Z0000 Encounter for general adult medical examination without abnormal findings: Secondary | ICD-10-CM | POA: Diagnosis not present

## 2021-03-22 DIAGNOSIS — I10 Essential (primary) hypertension: Secondary | ICD-10-CM | POA: Diagnosis not present

## 2021-03-22 DIAGNOSIS — L309 Dermatitis, unspecified: Secondary | ICD-10-CM | POA: Diagnosis not present

## 2021-03-22 DIAGNOSIS — I4891 Unspecified atrial fibrillation: Secondary | ICD-10-CM | POA: Diagnosis not present

## 2021-04-03 DIAGNOSIS — L821 Other seborrheic keratosis: Secondary | ICD-10-CM | POA: Diagnosis not present

## 2021-04-03 DIAGNOSIS — L820 Inflamed seborrheic keratosis: Secondary | ICD-10-CM | POA: Diagnosis not present

## 2021-04-03 DIAGNOSIS — L570 Actinic keratosis: Secondary | ICD-10-CM | POA: Diagnosis not present

## 2021-05-03 DIAGNOSIS — L578 Other skin changes due to chronic exposure to nonionizing radiation: Secondary | ICD-10-CM | POA: Diagnosis not present

## 2021-05-03 DIAGNOSIS — L570 Actinic keratosis: Secondary | ICD-10-CM | POA: Diagnosis not present

## 2021-05-03 DIAGNOSIS — D225 Melanocytic nevi of trunk: Secondary | ICD-10-CM | POA: Diagnosis not present

## 2021-05-03 DIAGNOSIS — L814 Other melanin hyperpigmentation: Secondary | ICD-10-CM | POA: Diagnosis not present

## 2021-05-03 DIAGNOSIS — D1801 Hemangioma of skin and subcutaneous tissue: Secondary | ICD-10-CM | POA: Diagnosis not present

## 2021-05-03 DIAGNOSIS — L821 Other seborrheic keratosis: Secondary | ICD-10-CM | POA: Diagnosis not present

## 2021-05-27 ENCOUNTER — Other Ambulatory Visit: Payer: Self-pay | Admitting: Cardiology

## 2021-05-29 NOTE — Telephone Encounter (Signed)
Prescription refill request for Eliquis received. Indication: afib  Last office visit: 09/06/2020, [place] Scr: 1.01, 09/06/2020 Age: 75 yo  Weight: 82 kg   Pt is on the correct dose of Eliquis per dosing criteria, prescription refill sent for Eliquis 5mg  BID.

## 2021-05-30 DIAGNOSIS — Z0100 Encounter for examination of eyes and vision without abnormal findings: Secondary | ICD-10-CM | POA: Diagnosis not present

## 2021-06-28 DIAGNOSIS — R3915 Urgency of urination: Secondary | ICD-10-CM | POA: Diagnosis not present

## 2021-06-28 DIAGNOSIS — N401 Enlarged prostate with lower urinary tract symptoms: Secondary | ICD-10-CM | POA: Diagnosis not present

## 2021-06-28 DIAGNOSIS — R3121 Asymptomatic microscopic hematuria: Secondary | ICD-10-CM | POA: Diagnosis not present

## 2021-09-14 ENCOUNTER — Encounter: Payer: Self-pay | Admitting: Cardiology

## 2021-09-14 ENCOUNTER — Ambulatory Visit: Payer: PPO | Admitting: Cardiology

## 2021-09-14 ENCOUNTER — Other Ambulatory Visit: Payer: Self-pay

## 2021-09-14 VITALS — BP 120/88 | HR 72 | Ht 69.0 in | Wt 181.0 lb

## 2021-09-14 DIAGNOSIS — I10 Essential (primary) hypertension: Secondary | ICD-10-CM | POA: Diagnosis not present

## 2021-09-14 DIAGNOSIS — I482 Chronic atrial fibrillation, unspecified: Secondary | ICD-10-CM

## 2021-09-14 LAB — BASIC METABOLIC PANEL
BUN/Creatinine Ratio: 15 (ref 10–24)
BUN: 14 mg/dL (ref 8–27)
CO2: 26 mmol/L (ref 20–29)
Calcium: 9 mg/dL (ref 8.6–10.2)
Chloride: 103 mmol/L (ref 96–106)
Creatinine, Ser: 0.93 mg/dL (ref 0.76–1.27)
Glucose: 78 mg/dL (ref 70–99)
Potassium: 4.6 mmol/L (ref 3.5–5.2)
Sodium: 141 mmol/L (ref 134–144)
eGFR: 86 mL/min/{1.73_m2} (ref >59)

## 2021-09-14 LAB — CBC
Hematocrit: 46.3 % (ref 37.5–51.0)
Hemoglobin: 15.3 g/dL (ref 13.0–17.7)
MCH: 29.9 pg (ref 26.6–33.0)
MCHC: 33 g/dL (ref 31.5–35.7)
MCV: 91 fL (ref 79–97)
Platelets: 200 10*3/uL (ref 150–450)
RBC: 5.11 x10E6/uL (ref 4.14–5.80)
RDW: 12.1 % (ref 11.6–15.4)
WBC: 4.3 10*3/uL (ref 3.4–10.8)

## 2021-09-14 MED ORDER — APIXABAN 5 MG PO TABS: 5.0000 mg | ORAL_TABLET | Freq: Two times a day (BID) | ORAL | 3 refills | Status: DC

## 2021-09-14 MED ORDER — METOPROLOL SUCCINATE ER 50 MG PO TB24: ORAL_TABLET | ORAL | 3 refills | Status: DC

## 2021-09-14 NOTE — Addendum Note (Signed)
Addended by: Carylon Perches on: 09/14/2021 08:56 AM   Modules accepted: Orders

## 2021-09-14 NOTE — Patient Instructions (Signed)
Medication Instructions:  Your physician recommends that you continue on your current medications as directed. Please refer to the Current Medication list given to you today.  *If you need a refill on your cardiac medications before your next appointment, please call your pharmacy*   Lab Work: TODAY: BMET, CBC  If you have labs (blood work) drawn today and your tests are completely normal, you will receive your results only by: [place] (if you have MyChart) OR A paper copy in the mail If you have any lab test that is abnormal or we need to change your treatment, we will call you to review the results.   Follow-Up: At [facility], you and your health needs are our priority.  As part of our continuing mission to provide you with exceptional heart care, we have created designated Provider Care Teams.  These Care Teams include your primary Cardiologist (physician) and Advanced Practice Providers (APPs -  Physician Assistants and Nurse Practitioners) who all work together to provide you with the care you need, when you need it.    Your next appointment:   1 year(s)  The format for your next appointment:   In Person  Provider:   You may see Fransico Him, MD or one of the following Advanced Practice Providers on your designated Care Team:   Melina Copa, PA-C Ermalinda Barrios, PA-C

## 2021-09-14 NOTE — Progress Notes (Signed)
Cardiology Office Note:    Date:  09/14/2021   ID:  Scott Newton, [place] [birth year], MRN [id number]  PCP:  London Pepper, MD  Cardiologist:  Fransico Him, MD    Referring MD: London Pepper, MD   Chief Complaint  Patient presents with   Atrial Fibrillation   Hypertension     History of Present Illness:    Scott Newton. Scott Newton is a 75 y.o. male with a hx of HTN, permanent atrial fibrillation with subsequent and DCCV and reccurrent afib and was asymptomatic and opted for rate control.  2D echo 11/16 showed normal LVEF and wall motion. EF was 60-65%.  He is on chronic Eliquis for CHADS2VASC score of 2.    He is here today for followup and is doing well.  He denies any chest pain or pressure, SOB, DOE, PND, orthopnea, LE edema, lightheadedness, palpitations or syncope. He is compliant with his meds and is tolerating meds with no SE.     Past Medical History:  Diagnosis Date   Anticoagulant long-term use    eliquis   BPH with obstruction/lower urinary tract symptoms    Chronic atrial fibrillation [facility]) cardiologist-  dr Tressia Newton Giovanie Newton   dx 10/ 2016---  s/p DCCV with reversion back to afib now pursuing rate control. 10-19-2015   Dysrhythmia 2015   Afib   ED (erectile dysfunction)    Essential hypertension    Full dentures    Gross hematuria    Peyronie's disease    Umbilical hernia    Weak urinary stream    Wears glasses     Past Surgical History:  Procedure Laterality Date   CARDIOVERSION N/A 10/19/2015   Procedure: CARDIOVERSION;  Surgeon: Scott Hector, MD;  Location: [place];  Service: Cardiovascular;  Laterality: N/A;   [place] N/A 03/18/2018   Procedure: CYSTOSCOPY WITH FULGERATION/ BLADDER BIOPSY;  Surgeon: Scott Aloe, MD;  Location[facility];  Service: Urology;  Laterality: N/A;   CYSTOSCOPY WITH FULGERATION N/A 09/15/2019   Procedure: CYSTOSCOPY WITH FULGERATION/BLADDER BIOPSY/ BILATERAL RETROGRADE;  Surgeon: Scott Aloe, MD;  Location: [facility];  Service: Urology;  Laterality: N/A;   CYSTOSCOPY WITH INSERTION OF UROLIFT N/A 03/18/2018   Procedure: CYSTOSCOPY WITH INSERTION OF UROLIFT;  Surgeon: Scott Aloe, MD;  [facility];  Service: Urology;  Laterality: N/A;   KNEE ARTHROSCOPY Left 2009  approx.   TONSILLECTOMY  child   TRANSTHORACIC ECHOCARDIOGRAM  09-27-2015  dr Tressia Newton Scott Newton   mild focal basal hypertrophy of the septum,  ef 60-65%/  trivial AR/  mild MR/  severe LAE/      Current Medications: Current Meds  Medication Sig   acetaminophen (TYLENOL) 500 MG tablet Take 500 mg by mouth every 6 (six) hours as needed for moderate pain or headache.   ELIQUIS 5 MG TABS tablet TAKE 1 TABLET BY MOUTH TWICE A DAY   metoprolol succinate (TOPROL-XL) 50 MG 24 hr tablet Take 1.5 tablets (75 mg total) every morning and 0.5 tablets (25 mg total) every evening. Take with or immediately following a meal.   Multiple Vitamins-Minerals (MULTIVITAMIN WITH MINERALS) tablet Take 1 tablet by mouth 2 (two) times a week.    sildenafil (REVATIO) 20 MG tablet Take 20-100 mg by mouth daily as needed for erectile dysfunction.     Allergies:   Patient has no known allergies.   Social History   Socioeconomic History   Marital status: Married    Spouse name: Not on  file   Number of children: 3   Years of education: college   Highest education level: Not on file  Occupational History   Occupation: unemployed  Tobacco Use   Smoking status: Some Days    Years: 20.00    Types: Cigars, Cigarettes   Smokeless tobacco: Never   Tobacco comments:    03-13-2018 per pt quit cigarettes 1990s, but currently smokes occasional cigar  Vaping Use   Vaping Use: Never used  Substance and Sexual Activity   Alcohol use: Yes    Alcohol/week: 14.0 standard drinks    Types: 14 Glasses of wine per week    Comment: average 2 wine daily   Drug use: No   Sexual activity: Not on file  Other Topics Concern    Not on file  Social History Narrative   Not on file   Social Determinants of Health   Financial Resource Strain: Not on file  Food Insecurity: Not on file  Transportation Needs: Not on file  Physical Activity: Not on file  Stress: Not on file  Social Connections: Not on file     Family History: The patient's family history includes Heart attack (age of onset: 37) in his father; Heart attack (age of onset: 38) in his brother; Heart disease (age of onset: 65) in his father; Hypertension (age of onset: 61) in his mother. There is no history of Stroke.  ROS:   Please see the history of present illness.    ROS  All other systems reviewed and negative.   EKGs/Labs/Other Studies Reviewed:    The following studies were reviewed today: none  EKG:  EKG is ordered today.  The ekg ordered today demonstrates atrial fibrillation with CVR and PVC  Recent Labs: No results found for requested labs within last 8760 hours.   Recent Lipid Panel No results found for: CHOL, TRIG, HDL, CHOLHDL, VLDL, LDLCALC, LDLDIRECT  Physical Exam:    VS:  BP 120/88   Pulse 72   Ht 5\' 9"  (1.753 m)   Wt 181 lb (82.1 kg)   SpO2 98%   BMI 26.73 kg/m     Wt Readings from Last 3 Encounters:  09/14/21 181 lb (82.1 kg)  09/06/20 180 lb 12.8 oz (82 kg)  09/09/19 174 lb 1.6 oz (79 kg)    GEN: Well nourished, well developed in no acute distress HEENT: Normal NECK: No JVD; No carotid bruits LYMPHATICS: No lymphadenopathy CARDIAC:irregularly irregular, no murmurs, rubs, gallops RESPIRATORY:  Clear to auscultation without rales, wheezing or rhonchi  ABDOMEN: Soft, non-tender, non-distended MUSCULOSKELETAL:  No edema; No deformity  SKIN: Warm and dry NEUROLOGIC:  Alert and oriented x 3 PSYCHIATRIC:  Normal affect   ASSESSMENT:    1. Chronic atrial fibrillation ([name])   2. Essential hypertension     PLAN:    In order of problems listed above:  1.  Chronic atrial fibrillation -he remains in atrial  fibrillation with CVR -he denies any bleeding problems on DOAC -Continue prescription drug management with Toprol XL 75mg  qam and 25mg  qpm and Eliquis 5mg  BID>>refilled today -check BMET and CBC  2.  HTN -BP controlled on exam today -continue prescription drug management with Toprol XL 75mg  qam and 25mg  qpm>refilled   Medication Adjustments/Labs and Tests Ordered: Current medicines are reviewed at length with the patient today.  Concerns regarding medicines are outlined above.  Orders Placed This Encounter  Procedures   EKG 12-Lead    No orders of the defined types were placed  in this encounter.   Signed, Fransico Him, MD  09/14/2021 8:48 AM    [place]

## 2021-09-26 DIAGNOSIS — Z23 Encounter for immunization: Secondary | ICD-10-CM | POA: Diagnosis not present

## 2022-01-05 DIAGNOSIS — N401 Enlarged prostate with lower urinary tract symptoms: Secondary | ICD-10-CM | POA: Diagnosis not present

## 2022-01-05 DIAGNOSIS — R3915 Urgency of urination: Secondary | ICD-10-CM | POA: Diagnosis not present

## 2022-01-05 DIAGNOSIS — N323 Diverticulum of bladder: Secondary | ICD-10-CM | POA: Diagnosis not present

## 2022-01-05 DIAGNOSIS — R3121 Asymptomatic microscopic hematuria: Secondary | ICD-10-CM | POA: Diagnosis not present

## 2022-01-19 DIAGNOSIS — M25562 Pain in left knee: Secondary | ICD-10-CM | POA: Diagnosis not present

## 2022-01-22 ENCOUNTER — Ambulatory Visit
Admission: RE | Admit: 2022-01-22 | Discharge: 2022-01-22 | Disposition: A | Discharge status: Home / Self Care | Payer: PPO | Source: Ambulatory Visit | Attending: Sports Medicine | Admitting: Sports Medicine

## 2022-01-22 ENCOUNTER — Other Ambulatory Visit: Payer: Self-pay | Admitting: Sports Medicine

## 2022-01-22 DIAGNOSIS — M25562 Pain in left knee: Secondary | ICD-10-CM

## 2022-04-25 ENCOUNTER — Encounter: Payer: Self-pay | Admitting: Cardiology

## 2022-05-01 DIAGNOSIS — Z13220 Encounter for screening for lipoid disorders: Secondary | ICD-10-CM | POA: Diagnosis not present

## 2022-05-01 DIAGNOSIS — I4891 Unspecified atrial fibrillation: Secondary | ICD-10-CM | POA: Diagnosis not present

## 2022-05-01 DIAGNOSIS — Z0000 Encounter for general adult medical examination without abnormal findings: Secondary | ICD-10-CM | POA: Diagnosis not present

## 2022-05-02 DIAGNOSIS — R208 Other disturbances of skin sensation: Secondary | ICD-10-CM | POA: Diagnosis not present

## 2022-05-02 DIAGNOSIS — L821 Other seborrheic keratosis: Secondary | ICD-10-CM | POA: Diagnosis not present

## 2022-05-02 DIAGNOSIS — Z0000 Encounter for general adult medical examination without abnormal findings: Secondary | ICD-10-CM | POA: Diagnosis not present

## 2022-05-02 DIAGNOSIS — I4891 Unspecified atrial fibrillation: Secondary | ICD-10-CM | POA: Diagnosis not present

## 2022-05-02 DIAGNOSIS — L570 Actinic keratosis: Secondary | ICD-10-CM | POA: Diagnosis not present

## 2022-05-02 DIAGNOSIS — L814 Other melanin hyperpigmentation: Secondary | ICD-10-CM | POA: Diagnosis not present

## 2022-05-02 DIAGNOSIS — I10 Essential (primary) hypertension: Secondary | ICD-10-CM | POA: Diagnosis not present

## 2022-08-14 ENCOUNTER — Other Ambulatory Visit: Payer: Self-pay | Admitting: Cardiology

## 2022-09-11 DIAGNOSIS — Z23 Encounter for immunization: Secondary | ICD-10-CM | POA: Diagnosis not present

## 2022-09-28 ENCOUNTER — Encounter: Payer: Self-pay | Admitting: Cardiology

## 2022-09-28 ENCOUNTER — Ambulatory Visit: Payer: PPO | Attending: Cardiology | Admitting: Cardiology

## 2022-09-28 VITALS — BP 140/90 | HR 78 | Ht 69.0 in | Wt 174.4 lb

## 2022-09-28 DIAGNOSIS — I10 Essential (primary) hypertension: Secondary | ICD-10-CM

## 2022-09-28 DIAGNOSIS — I482 Chronic atrial fibrillation, unspecified: Secondary | ICD-10-CM

## 2022-09-28 NOTE — Progress Notes (Signed)
Cardiology Office Note:    Date:  09/28/2022   ID:  Scott Newton, [place] 1946-07-29, MRN [id number]  PCP:  London Pepper, MD  Cardiologist:  Fransico Him, MD    Referring MD: London Pepper, MD   Chief Complaint  Patient presents with   Atrial Fibrillation     History of Present Illness:    Scott Newton is a 76 y.o. male with a hx of HTN, permanent atrial fibrillation with subsequent and DCCV and reccurrent afib and was asymptomatic and opted for rate control.  2DHe echo 11/16 showed normal LVEF and wall motion. EF was 60-65%.  He is on chronic Eliquis for CHADS2VASC score of 2.    He is here today for followup and is doing well.  He denies any chest pain or pressure, SOB, DOE, PND, orthopnea, LE edema, dizziness, palpitations or syncope. He is compliant with his meds and is tolerating meds with no SE.    Past Medical History:  Diagnosis Date   Anticoagulant long-term use    eliquis   BPH with obstruction/lower urinary tract symptoms    Chronic atrial fibrillation [facility]) cardiologist-  dr Tressia Miners Zani Kyllonen   dx 10/ 2016---  s/p DCCV with reversion back to afib now pursuing rate control. 10-19-2015   Dysrhythmia 2015   Afib   ED (erectile dysfunction)    Essential hypertension    Full dentures    Gross hematuria    Peyronie's disease    Umbilical hernia    Weak urinary stream    Wears glasses     Past Surgical History:  Procedure Laterality Date   CARDIOVERSION N/A 10/19/2015   Procedure: CARDIOVERSION;  Surgeon: Josue Hector, MD;  Location: [place];  Service: Cardiovascular;  Laterality: N/A;   [place] N/A 03/18/2018   Procedure: CYSTOSCOPY WITH FULGERATION/ BLADDER BIOPSY;  Surgeon: Festus Aloe, MD;  Location: [facility];  Service: Urology;  Laterality: N/A;   CYSTOSCOPY WITH FULGERATION N/A 09/15/2019   Procedure: CYSTOSCOPY WITH FULGERATION/BLADDER BIOPSY/ BILATERAL RETROGRADE;  Surgeon: Festus Aloe, MD;  [facility];  Service: Urology;  Laterality: N/A;   CYSTOSCOPY WITH INSERTION OF UROLIFT N/A 03/18/2018   Procedure: CYSTOSCOPY WITH INSERTION OF UROLIFT;  Surgeon: Festus Aloe, MD;  Location: [facility];  Service: Urology;  Laterality: N/A;   KNEE ARTHROSCOPY Left 2009  approx.   TONSILLECTOMY  child   TRANSTHORACIC ECHOCARDIOGRAM  09-27-2015  dr Tressia Miners [place]   mild focal basal hypertrophy of the septum,  ef 60-65%/  trivial AR/  mild MR/  severe LAE/      Current Medications: Current Meds  Medication Sig   acetaminophen (TYLENOL) 500 MG tablet Take 500 mg by mouth every 6 (six) hours as needed for moderate pain or headache.   apixaban (ELIQUIS) 5 MG TABS tablet Take 1 tablet (5 mg total) by mouth 2 (two) times daily.   metoprolol succinate (TOPROL-XL) 50 MG 24 hr tablet TAKE 1.5 TABLETS EVERY MORNING AND 0.5 TAB EVERY EVENING TAKE WITH OR IMMEDIATELY FOLLOWING A MEAL. PLease schedule yearly appointment for future refills. Thank you   Multiple Vitamins-Minerals (MULTIVITAMIN WITH MINERALS) tablet Take 1 tablet by mouth 2 (two) times a week.    sildenafil (REVATIO) 20 MG tablet Take 20-100 mg by mouth daily as needed for erectile dysfunction.     Allergies:   Patient has no known allergies.   Social History   Socioeconomic History   Marital status: Married  Spouse name: Not on file   Number of children: 3   Years of education: college   Highest education level: Not on file  Occupational History   Occupation: unemployed  Tobacco Use   Smoking status: Some Days    Years: 20.00    Types: Cigars, Cigarettes   Smokeless tobacco: Never   Tobacco comments:    03-13-2018 per pt quit cigarettes 1990s, but currently smokes occasional cigar  Vaping Use   Vaping Use: Never used  Substance and Sexual Activity   Alcohol use: Yes    Alcohol/week: 14.0 standard drinks of alcohol    Types: 14 Glasses of wine per week    Comment: average 2 wine daily   Drug use: No   Sexual  activity: Not on file  Other Topics Concern   Not on file  Social History Narrative   Not on file   Social Determinants of Health   Financial Resource Strain: Not on file  Food Insecurity: Not on file  Transportation Needs: Not on file  Physical Activity: Not on file  Stress: Not on file  Social Connections: Not on file     Family History: The patient's family history includes Heart attack (age of onset: 60) in his father; Heart attack (age of onset: 74) in his brother; Heart disease (age of onset: 71) in his father; Hypertension (age of onset: 25) in his mother. There is no history of Stroke.  ROS:   Please see the history of present illness.    ROS  All other systems reviewed and negative.   EKGs/Labs/Other Studies Reviewed:    The following studies were reviewed today: none  EKG:  EKG is ordered today.  The ekg ordered today demonstrates atrial fibrillation with CVR at 78bpm  Recent Labs: No results found for requested labs within last 365 days.   Recent Lipid Panel No results found for: "CHOL", "TRIG", "HDL", "CHOLHDL", "VLDL", "LDLCALC", "LDLDIRECT"  Physical Exam:    VS:  BP (!) 140/90   Pulse 78   Ht '5\' 9"'$  (1.753 m)   Wt 174 lb 6.4 oz (79.1 kg)   SpO2 98%   BMI 25.75 kg/m     Wt Readings from Last 3 Encounters:  09/28/22 174 lb 6.4 oz (79.1 kg)  09/14/21 181 lb (82.1 kg)  09/06/20 180 lb 12.8 oz (82 kg)    GEN: Well nourished, well developed in no acute distress HEENT: Normal NECK: No JVD; No carotid bruits LYMPHATICS: No lymphadenopathy CARDIAC:irregularly irregular, no murmurs, rubs, gallops RESPIRATORY:  Clear to auscultation without rales, wheezing or rhonchi  ABDOMEN: Soft, non-tender, non-distended MUSCULOSKELETAL:  No edema; No deformity  SKIN: Warm and dry NEUROLOGIC:  Alert and oriented x 3 PSYCHIATRIC:  Normal affect  ASSESSMENT:    1. Chronic atrial fibrillation ([place])   2. Essential hypertension     PLAN:    In order of problems  listed above:  1.  Chronic atrial fibrillation -Heart rate is well controlled in atrial fibrillation on exam today -He has not had any bleeding issues on Eliquis -Continue prescription management Toprol-XL 75 mg every morning and 25 mg every afternoon as well as Eliquis 5 mg twice daily with as needed refills -I have personally reviewed and interpreted outside labs performed by patient's PCP which showed SCr 0.94, K+ 4.6 and Hbg 14.5 on 04/2022  2.  HTN -BP is borderline controlled on exam today but at home BP runs 130/80's -Continue drug management Toprol-XL 75 mg every morning and  25 mg every afternoon with as needed refills  Medication Adjustments/Labs and Tests Ordered: Current medicines are reviewed at length with the patient today.  Concerns regarding medicines are outlined above.  Orders Placed This Encounter  Procedures   EKG 12-Lead    No orders of the defined types were placed in this encounter.   Signed, Fransico Him, MD  09/28/2022 11:12 AM    [place]

## 2022-09-28 NOTE — Patient Instructions (Addendum)
Medication Instructions:  Your physician recommends that you continue on your current medications as directed. Please refer to the Current Medication list given to you today.  *If you need a refill on your cardiac medications before your next appointment, please call your pharmacy*  Follow-Up: At [facility], you and your health needs are our priority.  As part of our continuing mission to provide you with exceptional heart care, we have created designated Provider Care Teams.  These Care Teams include your primary Cardiologist (physician) and Advanced Practice Providers (APPs -  Physician Assistants and Nurse Practitioners) who all work together to provide you with the care you need, when you need it.  Your next appointment:   1 year(s)  The format for your next appointment:   In Person  Provider:   Traci Turner, MD     Important Information About Sugar       

## 2022-10-08 ENCOUNTER — Encounter: Payer: Self-pay | Admitting: Cardiology

## 2022-10-09 ENCOUNTER — Telehealth: Payer: Self-pay

## 2022-10-09 NOTE — Telephone Encounter (Addendum)
    Primary Cardiologist: Fransico Him, MD  Chart reviewed as part of pre-operative protocol coverage. Dental extractions are considered low risk procedures per guidelines and generally do not require any specific cardiac clearance. It is also generally accepted that for extractions and dental cleanings, there is no need to interrupt blood thinner therapy.   SBE prophylaxis is not required for the patient.  I will route this recommendation to the requesting party via Epic fax function and remove from pre-op pool.  Please call with questions.  Deberah Pelton, NP 10/09/2022, 2:21 PM

## 2022-10-09 NOTE — Telephone Encounter (Signed)
   Pre-operative Risk Assessment    Patient Name: Scott Newton  DOB: [birth year] MRN: [id number]\     Request for Surgical Clearance    Procedure:  Surgical extraction (1)  Date of Surgery:  Clearance TBD                                 Surgeon:  Perlie Gold, DDAS Surgeon's Group or Practice Name:  [facility] Phone number:  [phone] Fax number:  [phone]   Type of Clearance Requested:   - Pharmacy:  Hold Apixaban (Eliquis)     Type of Anesthesia:  Local    Additional requests/questions:    Oneal Grout   10/09/2022, 1:30 PM

## 2022-10-27 DIAGNOSIS — R509 Fever, unspecified: Secondary | ICD-10-CM | POA: Diagnosis not present

## 2022-10-27 DIAGNOSIS — R52 Pain, unspecified: Secondary | ICD-10-CM | POA: Diagnosis not present

## 2022-10-27 DIAGNOSIS — J029 Acute pharyngitis, unspecified: Secondary | ICD-10-CM | POA: Diagnosis not present

## 2022-10-27 DIAGNOSIS — U071 COVID-19: Secondary | ICD-10-CM | POA: Diagnosis not present

## 2022-11-06 ENCOUNTER — Other Ambulatory Visit: Payer: Self-pay | Admitting: Cardiology

## 2022-11-06 DIAGNOSIS — I482 Chronic atrial fibrillation, unspecified: Secondary | ICD-10-CM

## 2022-11-06 NOTE — Telephone Encounter (Addendum)
Eliquis '5mg'$  refill request received. Patient is 76 years old, weight-79.1kg, Crea-0.94 on 05/01/2022 via [facility] from [place], [place], and last seen by Dr. Radford Pax on 09/28/2022. Dose is appropriate based on dosing criteria.

## 2022-11-20 IMAGING — CR DG KNEE 3 VIEWS*L*
3 series · 3 of 3 positions shown · non-contrast
Comparison: None.

CLINICAL DATA: Left knee pain.

EXAM:
LEFT KNEE - 3 VIEW

[w knee ap left]
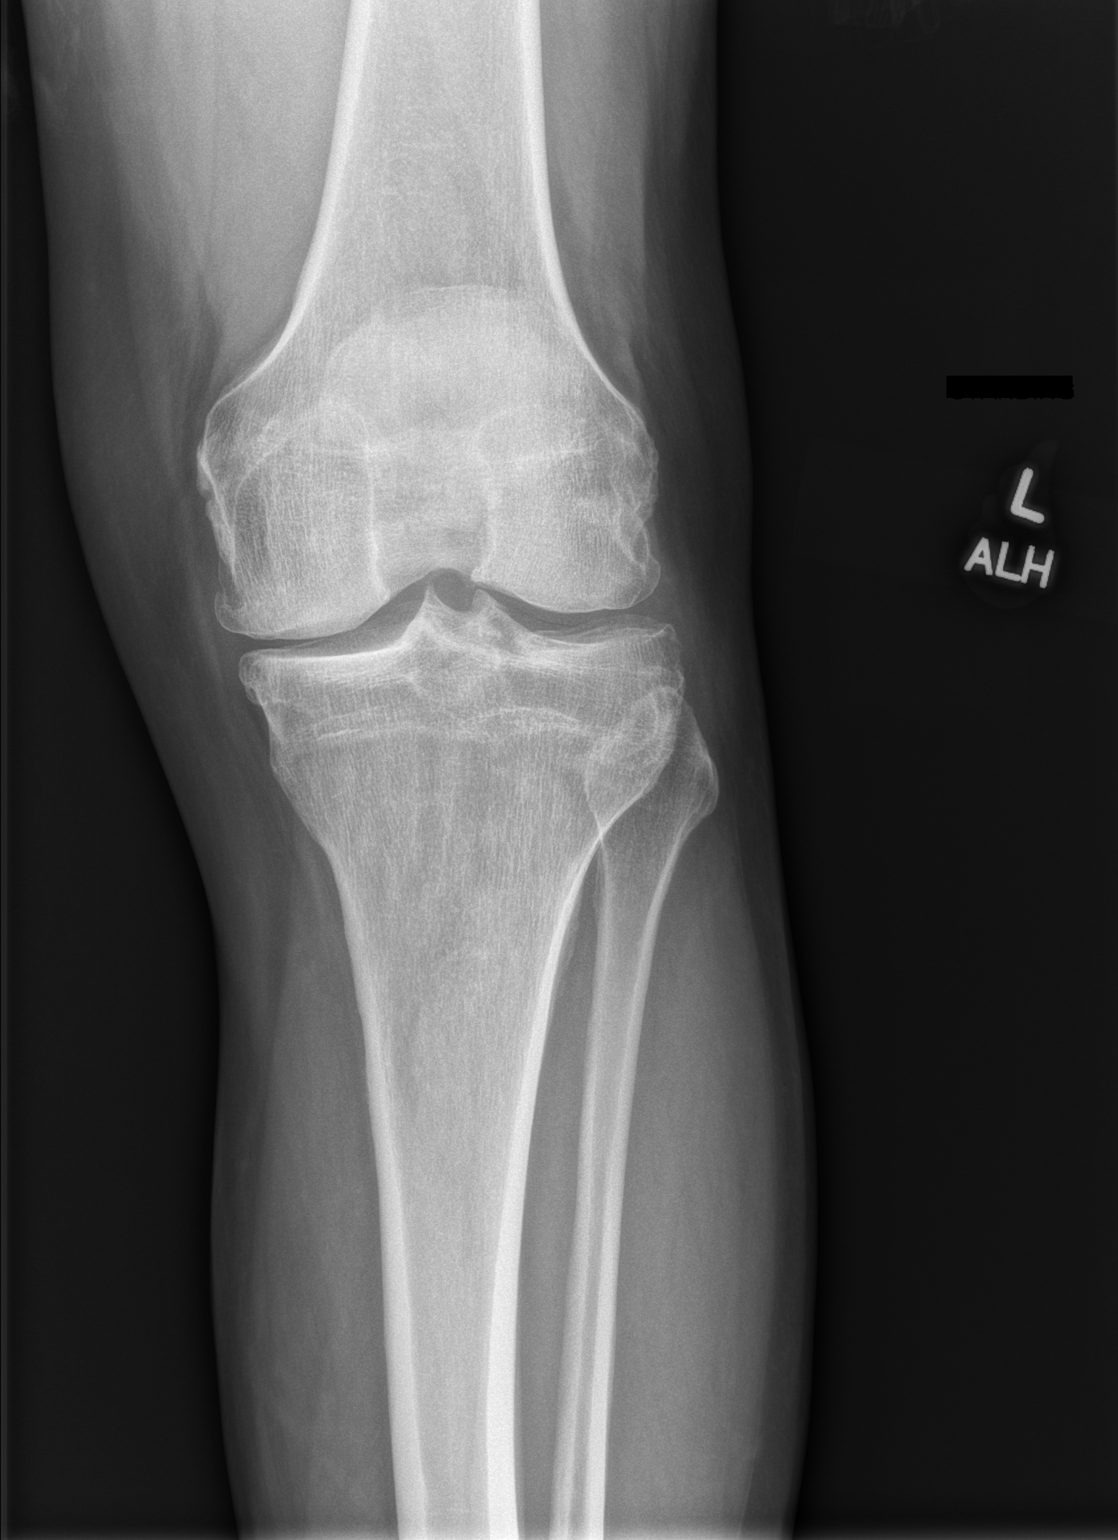

[w knee lat left]
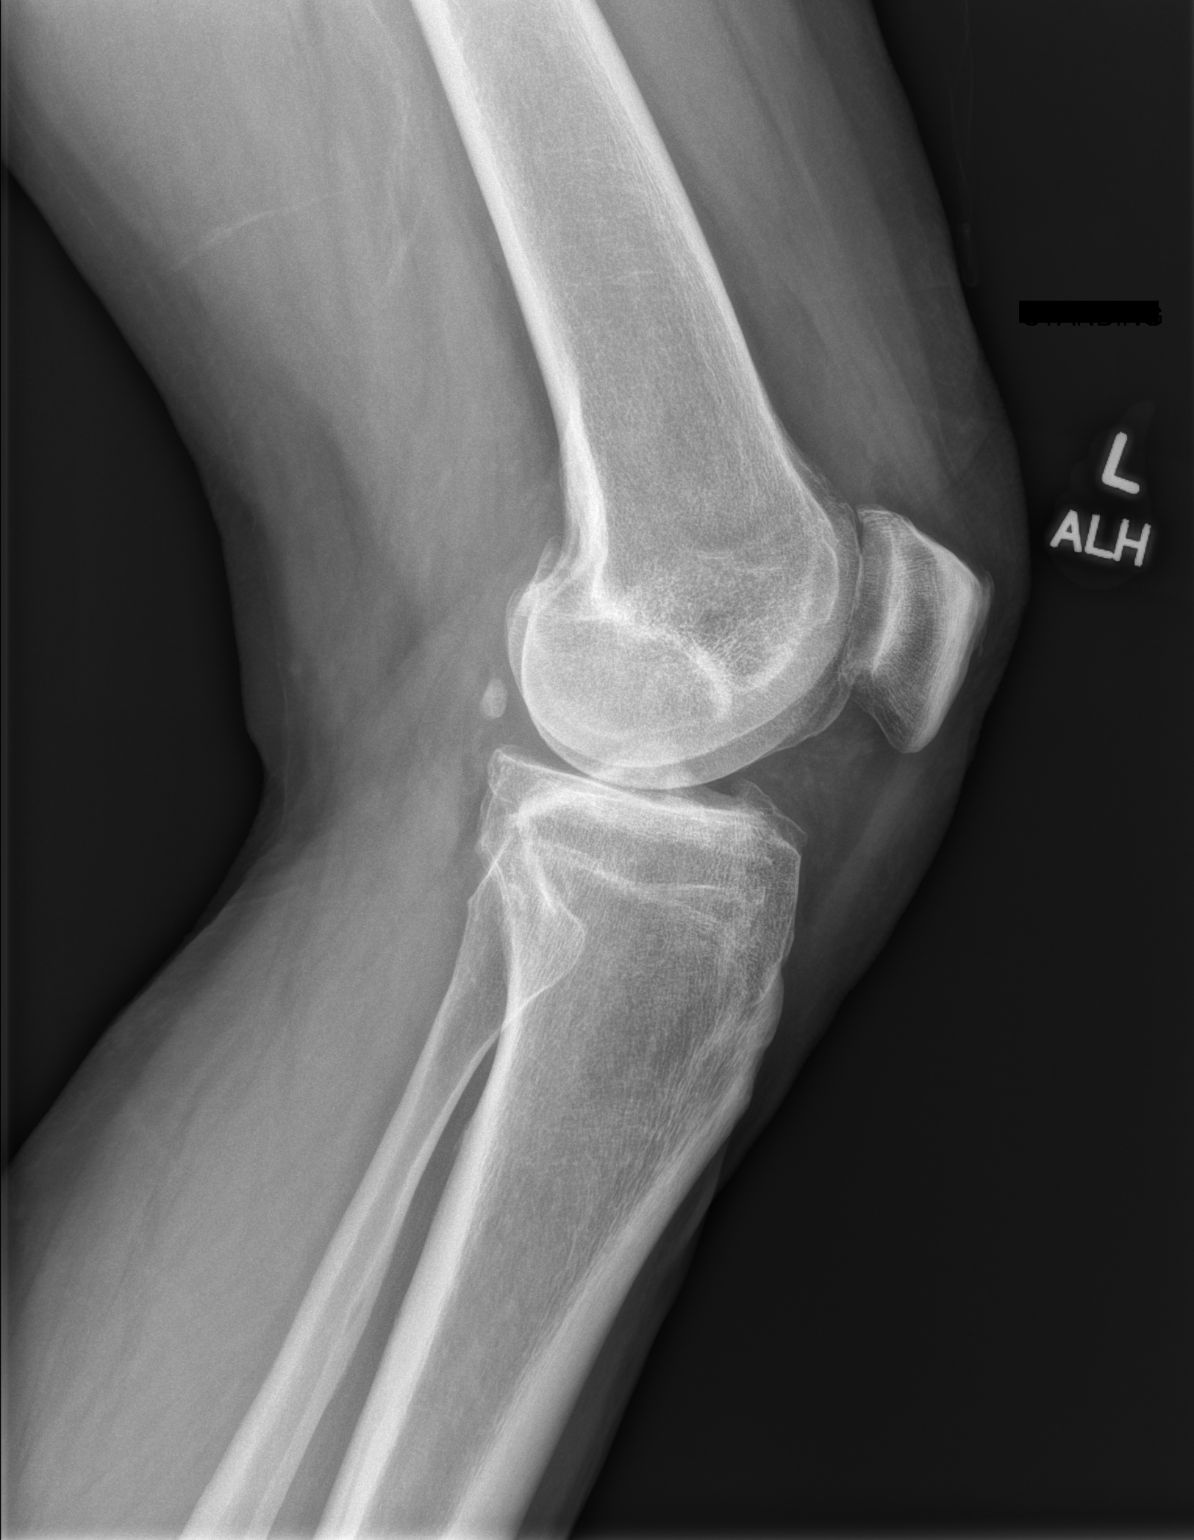

[x knee sunrise left]
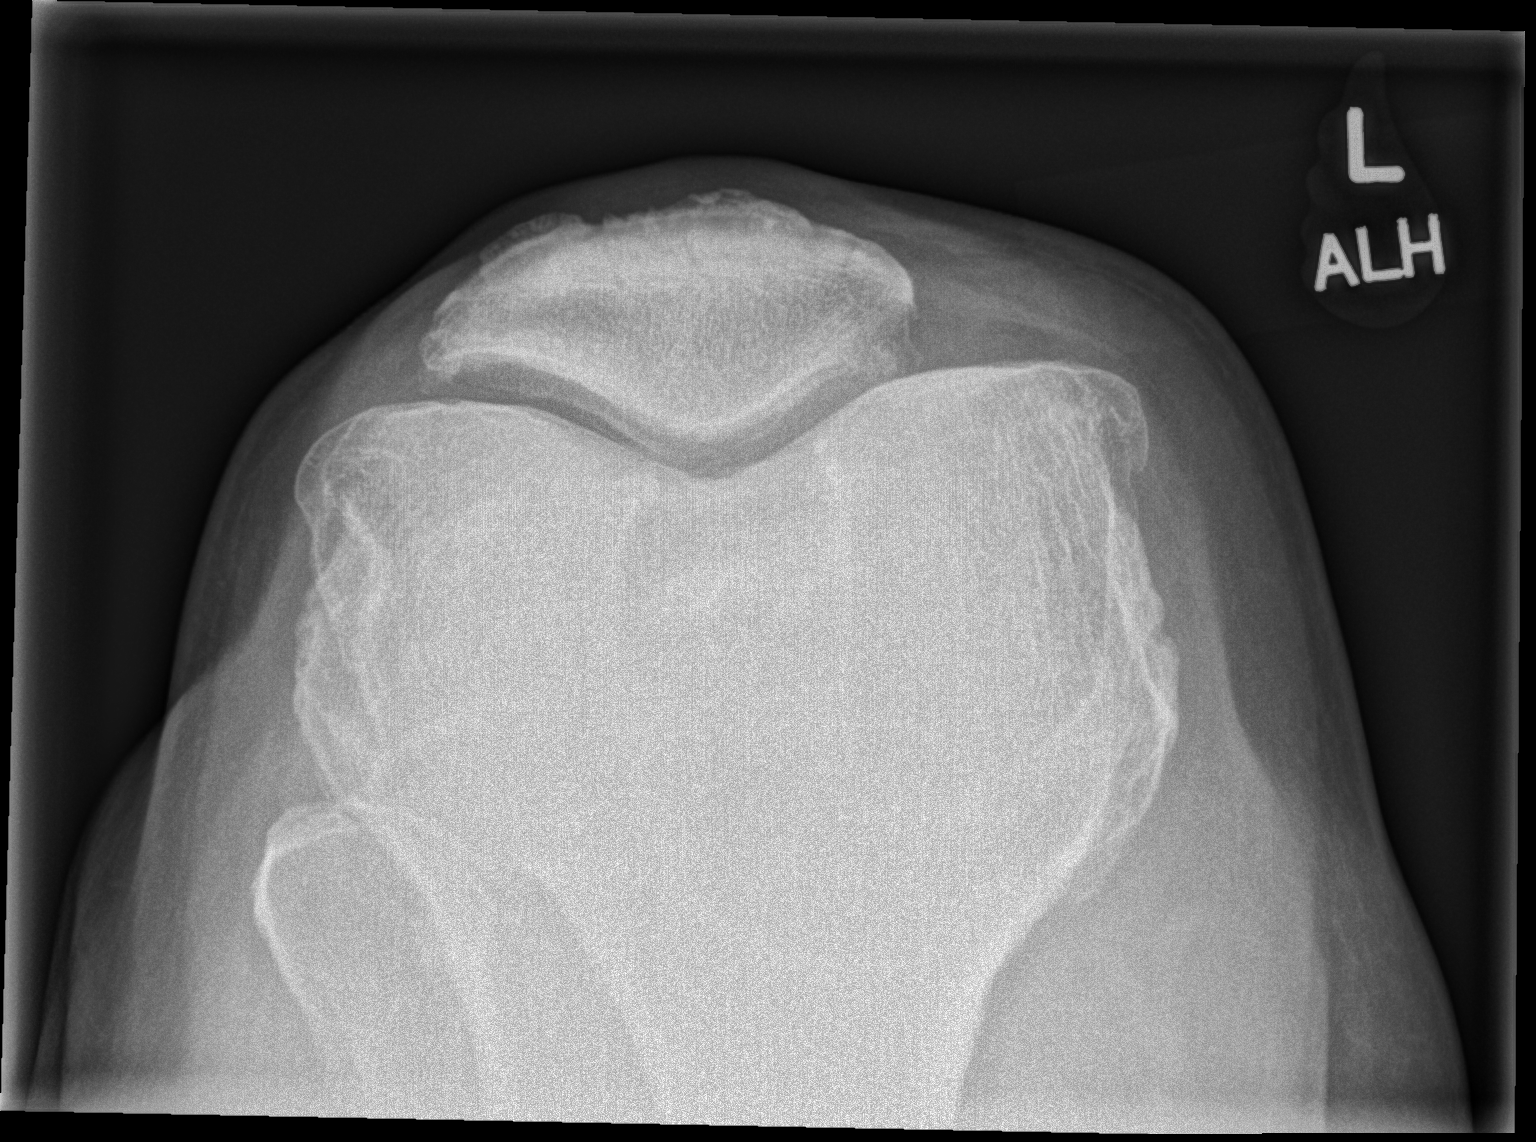

[3 of 3 positions shown; findings below may reference images not displayed]

FINDINGS: Mild-to-moderate medial compartment joint space narrowing and
peripheral osteophytosis degenerative change. Mild-to-moderate joint
space narrowing and predominantly superior and lateral
patellofemoral osteophytosis. Minimal chronic enthesopathic change
at the quadriceps insertion on the patella. Trace joint effusion. No
acute fracture or dislocation.
IMPRESSION: Moderate medial compartment and mild-to-moderate patellofemoral
compartment osteoarthritis.

## 2023-01-04 ENCOUNTER — Other Ambulatory Visit: Payer: Self-pay | Admitting: Cardiology

## 2023-01-04 DIAGNOSIS — I482 Chronic atrial fibrillation, unspecified: Secondary | ICD-10-CM

## 2023-01-04 NOTE — Telephone Encounter (Signed)
Prescription refill request for Eliquis received. Indication: a fib Last office visit:  09/28/22 Scr: overdue Age: 77 Weight: 79kg

## 2023-01-08 NOTE — Telephone Encounter (Signed)
Prescription refill request for Eliquis received. Indication: Afib  Last office visit: 09/28/22 Radford Pax)  Scr: 0.94 (05/01/22 via [facility])  Age: 77 Weight: 79.1kg  Appropriate dose. Refill sent.

## 2023-02-04 ENCOUNTER — Other Ambulatory Visit: Payer: Self-pay | Admitting: Cardiology

## 2023-03-18 DIAGNOSIS — R310 Gross hematuria: Secondary | ICD-10-CM | POA: Diagnosis not present

## 2023-03-18 DIAGNOSIS — N401 Enlarged prostate with lower urinary tract symptoms: Secondary | ICD-10-CM | POA: Diagnosis not present

## 2023-03-18 DIAGNOSIS — R3915 Urgency of urination: Secondary | ICD-10-CM | POA: Diagnosis not present

## 2023-03-18 DIAGNOSIS — N323 Diverticulum of bladder: Secondary | ICD-10-CM | POA: Diagnosis not present

## 2023-05-01 DIAGNOSIS — L821 Other seborrheic keratosis: Secondary | ICD-10-CM | POA: Diagnosis not present

## 2023-05-01 DIAGNOSIS — D225 Melanocytic nevi of trunk: Secondary | ICD-10-CM | POA: Diagnosis not present

## 2023-05-01 DIAGNOSIS — L814 Other melanin hyperpigmentation: Secondary | ICD-10-CM | POA: Diagnosis not present

## 2023-05-01 DIAGNOSIS — L570 Actinic keratosis: Secondary | ICD-10-CM | POA: Diagnosis not present

## 2023-05-24 DIAGNOSIS — L089 Local infection of the skin and subcutaneous tissue, unspecified: Secondary | ICD-10-CM | POA: Diagnosis not present

## 2023-06-08 DIAGNOSIS — N529 Male erectile dysfunction, unspecified: Secondary | ICD-10-CM | POA: Diagnosis not present

## 2023-06-17 DIAGNOSIS — R3912 Poor urinary stream: Secondary | ICD-10-CM | POA: Diagnosis not present

## 2023-06-17 DIAGNOSIS — R3915 Urgency of urination: Secondary | ICD-10-CM | POA: Diagnosis not present

## 2023-07-17 DIAGNOSIS — Z0100 Encounter for examination of eyes and vision without abnormal findings: Secondary | ICD-10-CM | POA: Diagnosis not present

## 2023-07-23 DIAGNOSIS — S76911A Strain of unspecified muscles, fascia and tendons at thigh level, right thigh, initial encounter: Secondary | ICD-10-CM | POA: Diagnosis not present

## 2023-07-23 DIAGNOSIS — T148XXA Other injury of unspecified body region, initial encounter: Secondary | ICD-10-CM | POA: Diagnosis not present

## 2023-08-01 ENCOUNTER — Other Ambulatory Visit: Payer: Self-pay | Admitting: Cardiology

## 2023-08-02 ENCOUNTER — Emergency Department (HOSPITAL_BASED_OUTPATIENT_CLINIC_OR_DEPARTMENT_OTHER): Payer: PPO

## 2023-08-02 ENCOUNTER — Emergency Department (HOSPITAL_BASED_OUTPATIENT_CLINIC_OR_DEPARTMENT_OTHER)
Admission: EM | Admit: 2023-08-02 | Discharge: 2023-08-02 | Disposition: A | Discharge status: Home / Self Care | Payer: PPO | Attending: Emergency Medicine | Admitting: Emergency Medicine

## 2023-08-02 ENCOUNTER — Encounter (HOSPITAL_BASED_OUTPATIENT_CLINIC_OR_DEPARTMENT_OTHER): Payer: Self-pay | Admitting: Emergency Medicine

## 2023-08-02 DIAGNOSIS — Y9353 Activity, golf: Secondary | ICD-10-CM | POA: Diagnosis not present

## 2023-08-02 DIAGNOSIS — Z79899 Other long term (current) drug therapy: Secondary | ICD-10-CM | POA: Insufficient documentation

## 2023-08-02 DIAGNOSIS — Z7901 Long term (current) use of anticoagulants: Secondary | ICD-10-CM | POA: Diagnosis not present

## 2023-08-02 DIAGNOSIS — M25551 Pain in right hip: Secondary | ICD-10-CM

## 2023-08-02 DIAGNOSIS — M1611 Unilateral primary osteoarthritis, right hip: Secondary | ICD-10-CM | POA: Diagnosis not present

## 2023-08-02 DIAGNOSIS — R1030 Lower abdominal pain, unspecified: Secondary | ICD-10-CM | POA: Insufficient documentation

## 2023-08-02 DIAGNOSIS — M7071 Other bursitis of hip, right hip: Secondary | ICD-10-CM | POA: Insufficient documentation

## 2023-08-02 DIAGNOSIS — M7061 Trochanteric bursitis, right hip: Secondary | ICD-10-CM | POA: Diagnosis not present

## 2023-08-02 DIAGNOSIS — R1031 Right lower quadrant pain: Secondary | ICD-10-CM | POA: Diagnosis not present

## 2023-08-02 LAB — URINALYSIS, ROUTINE W REFLEX MICROSCOPIC
Bilirubin Urine: NEGATIVE
Glucose, UA: NEGATIVE mg/dL
Ketones, ur: NEGATIVE mg/dL
Leukocytes,Ua: NEGATIVE
Nitrite: NEGATIVE
Protein, ur: 30 mg/dL — AB
RBC / HPF: >50 RBC/hpf (ref 0–5)
Specific Gravity, Urine: 1.021 (ref 1.005–1.030)
pH: 6 (ref 5.0–8.0)

## 2023-08-02 MED ORDER — LIDOCAINE 5 % EX PTCH: 1.0000 | MEDICATED_PATCH | CUTANEOUS | 0 refills | Status: DC

## 2023-08-02 MED ORDER — HYDROCODONE-ACETAMINOPHEN 5-325 MG PO TABS: 1.0000 | ORAL_TABLET | Freq: Four times a day (QID) | ORAL | 0 refills | Status: DC | PRN

## 2023-08-02 NOTE — ED Triage Notes (Signed)
Groin pain x a few weeks. Seen by pcp, given meloxicam  No relief Ice and rest helps some but still having pain.

## 2023-08-02 NOTE — Discharge Instructions (Addendum)
You were seen in the emergency department for right hip/groin pain.  You had a CAT scan of your hip and groin that showed some bursitis in the muscles around your hip.  I am prescribing you some lidocaine patches that may help and some pain medication.  Please follow-up with your orthopedic doctor or sports medicine for further evaluation.  Return if any worsening or concerning symptoms.

## 2023-08-02 NOTE — ED Provider Notes (Signed)
[place] EMERGENCY DEPARTMENT AT [facility] Provider Note   CSN: [id number] Arrival date & time: 08/02/23  1251     History  Chief Complaint  Patient presents with   Groin Pain    Scott Newton is a 77 y.o. male.  He is here with right hip and groin pain that is been going on for a few weeks.  He feels he injured it playing golf.  It was just sore but it has been getting progressively worse.  It bothers him when his leg is not in a perfect position.  Difficulty with flexing his hip.  No clear trauma.  Saw sports medicine and they gave him some an NSAID which she tried for a week and did not seem to help.  No numbness or weakness.  No testicular pain or swelling no dysuria no abdominal pain nausea vomiting.  The history is provided by the patient.  Groin Pain This is a new problem. The current episode started more than 1 week ago. The problem has been gradually worsening. Pertinent negatives include no chest pain, no abdominal pain, no headaches and no shortness of breath. The symptoms are aggravated by bending and walking. Nothing relieves the symptoms. He has tried rest (nsaid) for the symptoms. The treatment provided no relief.       Home Medications Prior to Admission medications   Medication Sig Start Date End Date Taking? Authorizing Provider  acetaminophen (TYLENOL) 500 MG tablet Take 500 mg by mouth every 6 (six) hours as needed for moderate pain or headache.    [provider]  ELIQUIS 5 MG TABS tablet TAKE 1 TABLET BY MOUTH TWICE A DAY 01/08/23   Quintella Reichert, MD  metoprolol succinate (TOPROL-XL) 50 MG 24 hr tablet TAKE 1.5 TABLETS EVERY MORNING AND 0.5 TAB EVERY EVENING TAKE WITH OR IMMEDIATELY FOLLOWING A MEAL. 08/01/23   Quintella Reichert, MD  Multiple Vitamins-Minerals (MULTIVITAMIN WITH MINERALS) tablet Take 1 tablet by mouth 2 (two) times a week.     [provider]  sildenafil (REVATIO) 20 MG tablet Take 20-100 mg by mouth daily as needed  for erectile dysfunction. 05/22/19   [provider]      Allergies    Patient has no known allergies.    Review of Systems   Review of Systems  Constitutional:  Negative for fever.  Respiratory:  Negative for shortness of breath.   Cardiovascular:  Negative for chest pain.  Gastrointestinal:  Negative for abdominal pain.  Genitourinary:  Negative for dysuria and testicular pain.  Musculoskeletal:  Negative for back pain.  Neurological:  Negative for headaches.    Physical Exam Updated Vital Signs BP (!) 145/87 (BP Location: Right Arm)   Pulse 92   Temp 98.5 F (36.9 C) (Oral)   Resp 18   SpO2 99%  Physical Exam Vitals and nursing note reviewed.  Constitutional:      General: He is not in acute distress.    Appearance: Normal appearance. He is well-developed.  HENT:     Head: Normocephalic and atraumatic.  Eyes:     Conjunctiva/sclera: Conjunctivae normal.  Cardiovascular:     Rate and Rhythm: Normal rate and regular rhythm.     Heart sounds: No murmur heard. Pulmonary:     Effort: Pulmonary effort is normal. No respiratory distress.     Breath sounds: Normal breath sounds.  Abdominal:     Palpations: Abdomen is soft.     Tenderness: There is no abdominal  tenderness. There is no guarding or rebound.  Genitourinary:    Comments: Testicular exam unremarkable.  No gross hernia appreciated. Musculoskeletal:        General: No swelling, tenderness or deformity. Normal range of motion.     Cervical back: Neck supple.  Skin:    General: Skin is warm and dry.     Capillary Refill: Capillary refill takes less than 2 seconds.  Neurological:     General: No focal deficit present.     Mental Status: He is alert.     Sensory: No sensory deficit.     Motor: No weakness.     ED Results / Procedures / Treatments   Labs (all labs ordered are listed, but only abnormal results are displayed) Labs Reviewed  URINALYSIS, ROUTINE W REFLEX MICROSCOPIC - Abnormal; Notable  for the following components:      Result Value   APPearance HAZY (*)    Hgb urine dipstick LARGE (*)    Protein, ur 30 (*)    Bacteria, UA RARE (*)    All other components within normal limits    EKG None  Radiology CT Hip Right Wo Contrast  Result Date: 08/02/2023 CLINICAL DATA:  Groin and hip pain EXAM: CT OF THE RIGHT HIP WITHOUT CONTRAST TECHNIQUE: Multidetector CT imaging of the right hip was performed according to the standard protocol. Multiplanar CT image reconstructions were also generated. RADIATION DOSE REDUCTION: This exam was performed according to the departmental dose-optimization program which includes automated exposure control, adjustment of the mA and/or kV according to patient size and/or use of iterative reconstruction technique. COMPARISON:  CT pelvis 01/28/2018 FINDINGS: Bones/Joint/Cartilage Moderate spurring of the right femoral head and acetabulum along with some fragmented spurring along the distal iloipsoas attachment to the lesser trochanter. No cortical discontinuity to indicate fracture. No sclerotic band identified 2 indicate a stress fracture, although MRI is more sensitive in detecting subtle stress reaction. There is only mild craniocaudad and axial loss of articular cartilage thickness. Small degenerative subcortical cystic lesions are present along the upper acetabular margin. No hip joint effusion is evident. However, there is notable iliopsoas bursitis as shown for example on image 41 series 5. Ligaments Suboptimally assessed by CT. Muscles and Tendons Unremarkable Soft tissues Small right posterior bladder diverticulum. Metal structures along the anterior superior prostate gland are probably from a prior UroLift, less likely fiducials. Correlate with prostate history. Fatty prominence along the proximal right spermatic cord. IMPRESSION: 1. Iliopsoas bursitis. 2. Moderate osteoarthritis of the right hip. 3. Small right posterior bladder diverticulum.  Electronically Signed   By: Gaylyn Rong M.D.   On: 08/02/2023 15:52    Procedures Procedures    Medications Ordered in ED Medications - No data to display  ED Course/ Medical Decision Making/ A&P                                 Medical Decision Making Amount and/or Complexity of Data Reviewed Labs: ordered. Radiology: ordered.  Risk Prescription drug management.   This patient complains of right hip and groin pain; this involves an extensive number of treatment Options and is a complaint that carries with it a high risk of complications and morbidity. The differential includes fracture, dislocation, bursitis, hematoma, renal colic, hernia  I ordered, reviewed and interpreted labs, which included urinalysis with hematuria I ordered imaging studies which included CT hip and I independently    visualized and  interpreted imaging which showed no fracture, does have iliopsoas bursitis and hip arthritis Previous records obtained and reviewed in epic including recent sports medicine visit Cardiac monitoring reviewed, sinus rhythm Social determinants considered, no significant barriers Critical Interventions: None  After the interventions stated above, I reevaluated the patient and found patient to be resting comfortably in no distress, hemodynamically stable Admission and further testing considered, we discussed getting a CT to evaluate if he had a kidney stone.  He said he has had this hematuria before and he has follow-up with urology.  He feels this is likely muscular and will try Lidoderm patch and prescription for short-term pain medication.  He will follow-up with the sports medicine team for further treatment and evaluation of this.  Return instructions discussed         Final Clinical Impression(s) / ED Diagnoses Final diagnoses:  Right hip pain  Iliopsoas bursitis of right hip    Rx / DC Orders ED Discharge Orders          Ordered    lidocaine (LIDODERM)  5 %  Every 24 hours        08/02/23 1538    HYDROcodone-acetaminophen (NORCO/VICODIN) 5-325 MG tablet  Every 6 hours PRN        08/02/23 1538              Terrilee Files, MD 08/02/23 1730

## 2023-08-06 DIAGNOSIS — M7071 Other bursitis of hip, right hip: Secondary | ICD-10-CM | POA: Diagnosis not present

## 2023-08-07 DIAGNOSIS — M25651 Stiffness of right hip, not elsewhere classified: Secondary | ICD-10-CM | POA: Diagnosis not present

## 2023-08-07 DIAGNOSIS — M25551 Pain in right hip: Secondary | ICD-10-CM | POA: Diagnosis not present

## 2023-08-07 DIAGNOSIS — R262 Difficulty in walking, not elsewhere classified: Secondary | ICD-10-CM | POA: Diagnosis not present

## 2023-08-07 DIAGNOSIS — M6281 Muscle weakness (generalized): Secondary | ICD-10-CM | POA: Diagnosis not present

## 2023-08-09 DIAGNOSIS — M6281 Muscle weakness (generalized): Secondary | ICD-10-CM | POA: Diagnosis not present

## 2023-08-09 DIAGNOSIS — M25651 Stiffness of right hip, not elsewhere classified: Secondary | ICD-10-CM | POA: Diagnosis not present

## 2023-08-09 DIAGNOSIS — R262 Difficulty in walking, not elsewhere classified: Secondary | ICD-10-CM | POA: Diagnosis not present

## 2023-08-09 DIAGNOSIS — M25551 Pain in right hip: Secondary | ICD-10-CM | POA: Diagnosis not present

## 2023-08-12 DIAGNOSIS — R262 Difficulty in walking, not elsewhere classified: Secondary | ICD-10-CM | POA: Diagnosis not present

## 2023-08-12 DIAGNOSIS — M6281 Muscle weakness (generalized): Secondary | ICD-10-CM | POA: Diagnosis not present

## 2023-08-12 DIAGNOSIS — M25551 Pain in right hip: Secondary | ICD-10-CM | POA: Diagnosis not present

## 2023-08-12 DIAGNOSIS — M25651 Stiffness of right hip, not elsewhere classified: Secondary | ICD-10-CM | POA: Diagnosis not present

## 2023-08-14 ENCOUNTER — Other Ambulatory Visit: Payer: Self-pay | Admitting: Cardiology

## 2023-08-14 DIAGNOSIS — R262 Difficulty in walking, not elsewhere classified: Secondary | ICD-10-CM | POA: Diagnosis not present

## 2023-08-14 DIAGNOSIS — M6281 Muscle weakness (generalized): Secondary | ICD-10-CM | POA: Diagnosis not present

## 2023-08-14 DIAGNOSIS — M25651 Stiffness of right hip, not elsewhere classified: Secondary | ICD-10-CM | POA: Diagnosis not present

## 2023-08-14 DIAGNOSIS — I482 Chronic atrial fibrillation, unspecified: Secondary | ICD-10-CM

## 2023-08-14 DIAGNOSIS — M25551 Pain in right hip: Secondary | ICD-10-CM | POA: Diagnosis not present

## 2023-08-14 NOTE — Telephone Encounter (Signed)
Prescription refill request for Eliquis received. Indication: AF Last office visit: 09/28/22  Kym Groom MD Scr: 0.94 on 05/01/22 Age: 76 Weight: 79.1kg  Based on above findings Eliquis 5mg  twice daily is the appropriate dose.  Refill approved.  Pt is due for labs.  Requested they be done at upcoming MD appt in November.

## 2023-08-19 DIAGNOSIS — M6281 Muscle weakness (generalized): Secondary | ICD-10-CM | POA: Diagnosis not present

## 2023-08-19 DIAGNOSIS — M25651 Stiffness of right hip, not elsewhere classified: Secondary | ICD-10-CM | POA: Diagnosis not present

## 2023-08-19 DIAGNOSIS — R262 Difficulty in walking, not elsewhere classified: Secondary | ICD-10-CM | POA: Diagnosis not present

## 2023-08-19 DIAGNOSIS — M25551 Pain in right hip: Secondary | ICD-10-CM | POA: Diagnosis not present

## 2023-08-20 DIAGNOSIS — M7071 Other bursitis of hip, right hip: Secondary | ICD-10-CM | POA: Diagnosis not present

## 2023-08-28 DIAGNOSIS — M25551 Pain in right hip: Secondary | ICD-10-CM | POA: Diagnosis not present

## 2023-08-28 DIAGNOSIS — M6281 Muscle weakness (generalized): Secondary | ICD-10-CM | POA: Diagnosis not present

## 2023-08-28 DIAGNOSIS — M25651 Stiffness of right hip, not elsewhere classified: Secondary | ICD-10-CM | POA: Diagnosis not present

## 2023-08-28 DIAGNOSIS — R262 Difficulty in walking, not elsewhere classified: Secondary | ICD-10-CM | POA: Diagnosis not present

## 2023-08-30 DIAGNOSIS — R262 Difficulty in walking, not elsewhere classified: Secondary | ICD-10-CM | POA: Diagnosis not present

## 2023-08-30 DIAGNOSIS — M25651 Stiffness of right hip, not elsewhere classified: Secondary | ICD-10-CM | POA: Diagnosis not present

## 2023-08-30 DIAGNOSIS — M25551 Pain in right hip: Secondary | ICD-10-CM | POA: Diagnosis not present

## 2023-08-30 DIAGNOSIS — M6281 Muscle weakness (generalized): Secondary | ICD-10-CM | POA: Diagnosis not present

## 2023-09-04 DIAGNOSIS — M25651 Stiffness of right hip, not elsewhere classified: Secondary | ICD-10-CM | POA: Diagnosis not present

## 2023-09-04 DIAGNOSIS — R262 Difficulty in walking, not elsewhere classified: Secondary | ICD-10-CM | POA: Diagnosis not present

## 2023-09-04 DIAGNOSIS — M25551 Pain in right hip: Secondary | ICD-10-CM | POA: Diagnosis not present

## 2023-09-04 DIAGNOSIS — M6281 Muscle weakness (generalized): Secondary | ICD-10-CM | POA: Diagnosis not present

## 2023-09-06 DIAGNOSIS — M6281 Muscle weakness (generalized): Secondary | ICD-10-CM | POA: Diagnosis not present

## 2023-09-06 DIAGNOSIS — M25551 Pain in right hip: Secondary | ICD-10-CM | POA: Diagnosis not present

## 2023-09-06 DIAGNOSIS — R262 Difficulty in walking, not elsewhere classified: Secondary | ICD-10-CM | POA: Diagnosis not present

## 2023-09-06 DIAGNOSIS — M25651 Stiffness of right hip, not elsewhere classified: Secondary | ICD-10-CM | POA: Diagnosis not present

## 2023-09-11 DIAGNOSIS — M25651 Stiffness of right hip, not elsewhere classified: Secondary | ICD-10-CM | POA: Diagnosis not present

## 2023-09-11 DIAGNOSIS — M25551 Pain in right hip: Secondary | ICD-10-CM | POA: Diagnosis not present

## 2023-09-11 DIAGNOSIS — M6281 Muscle weakness (generalized): Secondary | ICD-10-CM | POA: Diagnosis not present

## 2023-09-11 DIAGNOSIS — R262 Difficulty in walking, not elsewhere classified: Secondary | ICD-10-CM | POA: Diagnosis not present

## 2023-09-13 DIAGNOSIS — M6281 Muscle weakness (generalized): Secondary | ICD-10-CM | POA: Diagnosis not present

## 2023-09-13 DIAGNOSIS — R262 Difficulty in walking, not elsewhere classified: Secondary | ICD-10-CM | POA: Diagnosis not present

## 2023-09-13 DIAGNOSIS — M25551 Pain in right hip: Secondary | ICD-10-CM | POA: Diagnosis not present

## 2023-09-13 DIAGNOSIS — M25651 Stiffness of right hip, not elsewhere classified: Secondary | ICD-10-CM | POA: Diagnosis not present

## 2023-09-18 DIAGNOSIS — M6281 Muscle weakness (generalized): Secondary | ICD-10-CM | POA: Diagnosis not present

## 2023-09-18 DIAGNOSIS — M25651 Stiffness of right hip, not elsewhere classified: Secondary | ICD-10-CM | POA: Diagnosis not present

## 2023-09-18 DIAGNOSIS — R262 Difficulty in walking, not elsewhere classified: Secondary | ICD-10-CM | POA: Diagnosis not present

## 2023-09-18 DIAGNOSIS — M25551 Pain in right hip: Secondary | ICD-10-CM | POA: Diagnosis not present

## 2023-09-20 DIAGNOSIS — M6281 Muscle weakness (generalized): Secondary | ICD-10-CM | POA: Diagnosis not present

## 2023-09-20 DIAGNOSIS — M25651 Stiffness of right hip, not elsewhere classified: Secondary | ICD-10-CM | POA: Diagnosis not present

## 2023-09-20 DIAGNOSIS — M25551 Pain in right hip: Secondary | ICD-10-CM | POA: Diagnosis not present

## 2023-09-20 DIAGNOSIS — R262 Difficulty in walking, not elsewhere classified: Secondary | ICD-10-CM | POA: Diagnosis not present

## 2023-09-23 DIAGNOSIS — M25551 Pain in right hip: Secondary | ICD-10-CM | POA: Diagnosis not present

## 2023-09-23 DIAGNOSIS — M7071 Other bursitis of hip, right hip: Secondary | ICD-10-CM | POA: Diagnosis not present

## 2023-09-25 DIAGNOSIS — I4891 Unspecified atrial fibrillation: Secondary | ICD-10-CM | POA: Diagnosis not present

## 2023-09-25 DIAGNOSIS — M25551 Pain in right hip: Secondary | ICD-10-CM | POA: Diagnosis not present

## 2023-09-25 DIAGNOSIS — Z23 Encounter for immunization: Secondary | ICD-10-CM | POA: Diagnosis not present

## 2023-09-25 DIAGNOSIS — D6869 Other thrombophilia: Secondary | ICD-10-CM | POA: Diagnosis not present

## 2023-09-25 DIAGNOSIS — R262 Difficulty in walking, not elsewhere classified: Secondary | ICD-10-CM | POA: Diagnosis not present

## 2023-09-25 DIAGNOSIS — Z0000 Encounter for general adult medical examination without abnormal findings: Secondary | ICD-10-CM | POA: Diagnosis not present

## 2023-09-25 DIAGNOSIS — R7309 Other abnormal glucose: Secondary | ICD-10-CM | POA: Diagnosis not present

## 2023-09-25 DIAGNOSIS — M6281 Muscle weakness (generalized): Secondary | ICD-10-CM | POA: Diagnosis not present

## 2023-09-25 DIAGNOSIS — M25651 Stiffness of right hip, not elsewhere classified: Secondary | ICD-10-CM | POA: Diagnosis not present

## 2023-09-25 DIAGNOSIS — Z1211 Encounter for screening for malignant neoplasm of colon: Secondary | ICD-10-CM | POA: Diagnosis not present

## 2023-09-25 DIAGNOSIS — I10 Essential (primary) hypertension: Secondary | ICD-10-CM | POA: Diagnosis not present

## 2023-09-26 ENCOUNTER — Other Ambulatory Visit (HOSPITAL_BASED_OUTPATIENT_CLINIC_OR_DEPARTMENT_OTHER): Payer: Self-pay | Admitting: Sports Medicine

## 2023-09-26 DIAGNOSIS — R3914 Feeling of incomplete bladder emptying: Secondary | ICD-10-CM | POA: Diagnosis not present

## 2023-09-26 DIAGNOSIS — R310 Gross hematuria: Secondary | ICD-10-CM | POA: Diagnosis not present

## 2023-09-26 DIAGNOSIS — M25551 Pain in right hip: Secondary | ICD-10-CM

## 2023-09-26 DIAGNOSIS — N401 Enlarged prostate with lower urinary tract symptoms: Secondary | ICD-10-CM | POA: Diagnosis not present

## 2023-09-30 NOTE — Progress Notes (Unsigned)
Cardiology Office Note:  .   Date:  10/01/2023  ID:  Maisie Fus C. Iven, [place] [birth year], MRN [id number] PCP: Farris Has, MD  [facility] Providers Cardiologist:  Armanda Magic, MD {  History of Present Illness: .   Jahvier C. Webb is a 77 y.o. male with a past medical history of HTN, permanent atrial fibrillation with subsequent DCCV and recurrent A-fib and was asymptomatic and opted for rate control with normal LVEF of 60 to 65% 11/16 and on chronic Eliquis for CHA2DS2-VASc score of 2 here for follow-up appointment.  He was seen a year ago for follow-up and was doing well at that time without any cardiovascular symptoms.  Follow-up with medications and tolerating without any side effects.  Today, he presents, with a history of atrial fibrillation managed with metoprolol and Eliquis, with hip pain due to ileosolus and urinary issues. The hip pain has been ongoing for about seven to eight weeks and has been managed with physical therapy and a previous injection. The patient reports that the pain is localized to one spot and has not completely resolved despite therapy.  The patient also reports urinary issues, for which he has recently started taking finasteride and tamsulosin. The patient notes an immediate improvement in symptoms since starting these medications. The patient had previously been recommended for a surgical procedure to address these issues but opted to try medication management first.  Additionally, the patient reports experiencing dizziness upon standing, particularly noticeable during physical therapy sessions. The patient does not report any other significant symptoms or changes in health status. We discussed hydration status and slow transitions from doing physical therapy.  Close monitoring of blood pressure as well.  Reports no shortness of breath nor dyspnea on exertion. Reports no chest pain, pressure, or tightness. No edema, orthopnea, PND. Reports no palpitations.     Discussed the use of AI scribe software for clinical note transcription with the patient, who gave verbal consent to proceed.          ROS: Pertinent ROS in HPI  Studies Reviewed: .        None. Risk Assessment/Calculations:    CHA2DS2-VASc Score = 3   This indicates a 3.2% annual risk of stroke. The patient's score is based upon: CHF History: 0 HTN History: 1 Diabetes History: 0 Stroke History: 0 Vascular Disease History: 0 Age Score: 2 Gender Score: 0    LABS Hemoglobin A1c: 5.7 (09/18/2023) CBC: normal (09/18/2023) Kidney function: normal (09/18/2023) Electrolytes: normal (09/18/2023) Liver function: normal (09/18/2023) LDL: 76 (09/18/2023)   Physical Exam:   VS:  BP 134/88   Pulse 81   Ht 5\' 9"  (1.753 m)   Wt 171 lb 9.6 oz (77.8 kg)   SpO2 98%   BMI 25.34 kg/m    Wt Readings from Last 3 Encounters:  10/01/23 171 lb 9.6 oz (77.8 kg)  09/28/22 174 lb 6.4 oz (79.1 kg)  09/14/21 181 lb (82.1 kg)    GEN: Well nourished, well developed in no acute distress NECK: No JVD; No carotid bruits CARDIAC: IRIR, no murmurs, rubs, gallops RESPIRATORY:  Clear to auscultation without rales, wheezing or rhonchi  ABDOMEN: Soft, non-tender, non-distended EXTREMITIES:  No edema; No deformity   ASSESSMENT AND PLAN: .    Atrial Fibrillation Rate controlled with Metoprolol. No symptoms reported. -Continue Metoprolol 75mg  in the morning and 25mg  in the evening. -Monitor for symptoms and report any changes.   Orthostatic Hypotension Reports dizziness upon standing, particularly during physical therapy sessions. No  changes in Metoprolol dosage recommended at this time. -Increase hydration. -Take care with positional changes, particularly during physical therapy.  Hypertension -Diastolic blood pressure slightly elevated today at 88 mmHg, this is usual for the patient -Plan to continue current medication regimen in light of orthostatic hypotension and monitor blood  pressure closely at home   General Health Maintenance -Continue Magnesium 250mg  as tolerated, suggested taking it at night -Review recent labs from primary care physician which is outlined above      Dispo: He will follow-up in a year with Dr. Mayford Knife  Signed, Sharlene Dory, PA-C

## 2023-10-01 ENCOUNTER — Ambulatory Visit: Payer: PPO | Attending: Physician Assistant | Admitting: Physician Assistant

## 2023-10-01 ENCOUNTER — Encounter: Payer: Self-pay | Admitting: Physician Assistant

## 2023-10-01 VITALS — BP 134/88 | HR 81 | Ht 69.0 in | Wt 171.6 lb

## 2023-10-01 DIAGNOSIS — I482 Chronic atrial fibrillation, unspecified: Secondary | ICD-10-CM | POA: Diagnosis not present

## 2023-10-01 DIAGNOSIS — I10 Essential (primary) hypertension: Secondary | ICD-10-CM | POA: Diagnosis not present

## 2023-10-01 MED ORDER — APIXABAN 5 MG PO TABS: 5.0000 mg | ORAL_TABLET | Freq: Two times a day (BID) | ORAL | 3 refills | Status: DC

## 2023-10-01 MED ORDER — METOPROLOL SUCCINATE ER 50 MG PO TB24: ORAL_TABLET | ORAL | 3 refills | Status: DC

## 2023-10-01 NOTE — Patient Instructions (Signed)
Medication Instructions:  Your physician recommends that you continue on your current medications as directed. Please refer to the Current Medication list given to you today.  *If you need a refill on your cardiac medications before your next appointment, please call your pharmacy*   Lab Work: None ordered  If you have labs (blood work) drawn today and your tests are completely normal, you will receive your results only by: MyChart Message (if you have MyChart) OR A paper copy in the mail If you have any lab test that is abnormal or we need to change your treatment, we will call you to review the results.   Testing/Procedures: None ordered   Follow-Up: At [facility], you and your health needs are our priority.  As part of our continuing mission to provide you with exceptional heart care, we have created designated Provider Care Teams.  These Care Teams include your primary Cardiologist (physician) and Advanced Practice Providers (APPs -  Physician Assistants and Nurse Practitioners) who all work together to provide you with the care you need, when you need it.  We recommend signing up for the patient portal called "MyChart".  Sign up information is provided on this After Visit Summary.  MyChart is used to connect with patients for Virtual Visits (Telemedicine).  Patients are able to view lab/test results, encounter notes, upcoming appointments, etc.  Non-urgent messages can be sent to your provider as well.   To learn more about what you can do with MyChart, go to [email or web address].    Your next appointment:   1 year(s)  Provider:   Armanda Magic, MD     Other Instructions

## 2023-10-02 DIAGNOSIS — Z1211 Encounter for screening for malignant neoplasm of colon: Secondary | ICD-10-CM | POA: Diagnosis not present

## 2023-10-03 ENCOUNTER — Ambulatory Visit (HOSPITAL_BASED_OUTPATIENT_CLINIC_OR_DEPARTMENT_OTHER)
Admission: RE | Admit: 2023-10-03 | Discharge: 2023-10-03 | Disposition: A | Discharge status: Home / Self Care | Payer: PPO | Source: Ambulatory Visit | Attending: Sports Medicine | Admitting: Sports Medicine

## 2023-10-03 DIAGNOSIS — M25551 Pain in right hip: Secondary | ICD-10-CM | POA: Diagnosis not present

## 2023-10-03 DIAGNOSIS — M47816 Spondylosis without myelopathy or radiculopathy, lumbar region: Secondary | ICD-10-CM | POA: Diagnosis not present

## 2023-10-03 DIAGNOSIS — M1611 Unilateral primary osteoarthritis, right hip: Secondary | ICD-10-CM | POA: Diagnosis not present

## 2023-10-04 DIAGNOSIS — M6281 Muscle weakness (generalized): Secondary | ICD-10-CM | POA: Diagnosis not present

## 2023-10-04 DIAGNOSIS — M25551 Pain in right hip: Secondary | ICD-10-CM | POA: Diagnosis not present

## 2023-10-04 DIAGNOSIS — R262 Difficulty in walking, not elsewhere classified: Secondary | ICD-10-CM | POA: Diagnosis not present

## 2023-10-04 DIAGNOSIS — M25651 Stiffness of right hip, not elsewhere classified: Secondary | ICD-10-CM | POA: Diagnosis not present

## 2023-10-11 DIAGNOSIS — M25551 Pain in right hip: Secondary | ICD-10-CM | POA: Diagnosis not present

## 2023-10-21 DIAGNOSIS — M1611 Unilateral primary osteoarthritis, right hip: Secondary | ICD-10-CM | POA: Diagnosis not present

## 2023-11-14 DIAGNOSIS — R300 Dysuria: Secondary | ICD-10-CM | POA: Diagnosis not present

## 2023-12-27 ENCOUNTER — Telehealth: Payer: Self-pay | Admitting: *Deleted

## 2023-12-27 NOTE — Telephone Encounter (Signed)
   Pre-operative Risk Assessment    Patient Name: Scott Newton. Bivens  DOB: [birth year] MRN: [id number]   Date of last office visit: 10/01/23 TESSA CONTE Date of next office visit: NONE   Request for Surgical Clearance    Procedure:  RIGHT TOTAL HIP REPLACEMENT  Date of Surgery:  Clearance TBD                                Surgeon:  DR. EVALENE CHANCY Surgeon's Group or Practice Name:  [facility] Phone number:  [phone] [address] Fax number:  [phone]   Type of Clearance Requested:   - Medical  - Pharmacy:  Hold Apixaban  (Eliquis )     Type of Anesthesia:  Spinal   Additional requests/questions:    [name]   12/27/2023, 12:20 PM

## 2023-12-30 ENCOUNTER — Telehealth: Payer: Self-pay | Admitting: *Deleted

## 2023-12-30 NOTE — Telephone Encounter (Signed)
 Patient is calling to schedule his tele appt visit for clearance

## 2023-12-30 NOTE — Telephone Encounter (Signed)
 Primary Cardiologist:Traci Micael Adas, MD   Preoperative team, please contact this patient and set up a phone call appointment for further preoperative risk assessment. Please obtain consent and complete medication review. Thank you for your help.   Per office protocol, patient can hold Eliquis  for 3 days prior to procedure.    I also confirmed the patient resides in the state of [place] . As per [facility] Medical Board telemedicine laws, the patient must reside in the state in which the provider is licensed.   Gerldine Koch, NP-C  12/30/2023, 4:47 PM [address] Office [phone] Fax [phone]

## 2023-12-30 NOTE — Telephone Encounter (Signed)
 I called the pt to schedule tele preop appt. Our 1st is not until 01/14/24 I told the pt. He asked any way to get it done sooner. I did offer if he wanted in office appt. Pt said yet. Pt agreeable to in office at [facility] 01/06/24 8:50 Lawana Pray, FNP. Pt was grateful and thanked me for the help.   I will update all parties involved. Ok per preop APP Slater Duncan, NP to schedule in office if pt chooses.

## 2023-12-30 NOTE — Telephone Encounter (Signed)
 Patient with diagnosis of afib on Eliquis  for anticoagulation.    Procedure:  RIGHT TOTAL HIP REPLACEMENT  Date of procedure: TBD   CHA2DS2-VASc Score = 3   This indicates a 3.2% annual risk of stroke. The patient's score is based upon: CHF History: 0 HTN History: 1 Diabetes History: 0 Stroke History: 0 Vascular Disease History: 0 Age Score: 2 Gender Score: 0      CrCl 66 ml/min Platelet count 211  Per office protocol, patient can hold Eliquis  for 3 days prior to procedure.    **This guidance is not considered finalized until pre-operative APP has relayed final recommendations.**

## 2024-01-02 NOTE — Progress Notes (Signed)
 Cardiology Clinic Note   Patient Name: Scott Newton. Lucretia Roers Date of Encounter: 01/06/2024  Primary Care Provider:  Farris Has, MD Primary Cardiologist:  Armanda Magic, MD  Patient Profile    Scott Newton 78 year old male presents to the clinic today for follow-up evaluation of his hypertension and preoperative cardiac evaluation.  Past Medical History    Past Medical History:  Diagnosis Date   Anticoagulant long-term use    eliquis   BPH with obstruction/lower urinary tract symptoms    Chronic atrial fibrillation [facility]) cardiologist-  dr Gloris [place] turner   dx 10/ 2016---  s/p DCCV with reversion back to afib now pursuing rate control. 10-19-2015   Dysrhythmia 2015   Afib   ED (erectile dysfunction)    Essential hypertension    Full dentures    Gross hematuria    Peyronie's disease    Umbilical hernia    Weak urinary stream    Wears glasses    Past Surgical History:  Procedure Laterality Date   CARDIOVERSION N/A 10/19/2015   Procedure: CARDIOVERSION;  Surgeon: Wendall Stade, MD;  Location: [facility] ENDOSCOPY;  Service: Cardiovascular;  Laterality: N/A;   CYSTOSCOPY WITH FULGERATION N/A 03/18/2018   Procedure: CYSTOSCOPY WITH FULGERATION/ BLADDER BIOPSY;  Surgeon: Jerilee Field, MD;  Location: [facility];  Service: Urology;  Laterality: N/A;   CYSTOSCOPY WITH FULGERATION N/A 09/15/2019   Procedure: CYSTOSCOPY WITH FULGERATION/BLADDER BIOPSY/ BILATERAL RETROGRADE;  Surgeon: Jerilee Field, MD;  Location: [facility] ORS;  Service: Urology;  Laterality: N/A;   CYSTOSCOPY WITH INSERTION OF UROLIFT N/A 03/18/2018   Procedure: CYSTOSCOPY WITH INSERTION OF UROLIFT;  Surgeon: Jerilee Field, MD;  Location: [facility];  Service: Urology;  Laterality: N/A;   KNEE ARTHROSCOPY Left 2009  approx.   TONSILLECTOMY  child   TRANSTHORACIC ECHOCARDIOGRAM  09-27-2015  dr Gloris Manchester turner   mild focal basal hypertrophy of the septum,  ef 60-65%/  trivial AR/  mild MR/   severe LAE/      Allergies  No Known Allergies  History of Present Illness    Scott Newton has a PMH of hypertension, permanent atrial fibrillation with DCCV and recurrent atrial fibrillation (asymptomatic), LVEF 60 to 65% 11/16.  His CHA2DS2-VASc score is 2.  He was seen in follow-up by Jari Favre, PA-C on 10/01/2023.  He was stable from a cardiac standpoint at that time.  He was tolerating his medications well.  He denied side effects.  He reported hip pain due toileosolus and urinary issues.  He was seeing physical therapy.  He also had previous injection.  He also noted dizziness upon standing.  He was noticing this during physical therapy sessions.  He was encouraged to increase his hydration and change positions slowly.  He denied dyspnea, chest pain, lower extremity swelling, orthopnea and PND.  He presents to the clinic today for follow-up evaluation and preoperative cardiac evaluation.  He states he continues to do well from a cardiac standpoint.  He was very physically active until his right hip started hurting him about 3 months ago.  His blood pressure is well-controlled today at 130/80.  He is tolerating his medications well and denies side effects.  He does note that he is also having microscopic blood in his urine and will need to have a urologic procedure after his hip surgery.  I have asked him to have Dr. Mena Goes send [place] his preoperative request.  I will continue his current medication regimen, give the salty 6  diet sheet and plan follow-up in 12 months.  Today he denies chest pain, shortness of breath, lower extremity edema, fatigue, palpitations, melena, hematuria, hemoptysis, diaphoresis, weakness, presyncope, syncope, orthopnea, and PND.       Home Medications    Prior to Admission medications   Medication Sig Start Date End Date Taking? Authorizing Provider  acetaminophen (TYLENOL) 500 MG tablet Take 500 mg by mouth every 6 (six) hours as needed for moderate pain  or headache.    [provider]  apixaban (ELIQUIS) 5 MG TABS tablet Take 1 tablet (5 mg total) by mouth 2 (two) times daily. 10/01/23   Sharlene Dory, PA-C  finasteride (PROSCAR) 5 MG tablet Take 5 mg by mouth daily. 09/26/23   [provider]  Magnesium 250 MG TABS Take 250 mg by mouth daily at 6 (six) AM.    [provider]  metoprolol succinate (TOPROL-XL) 50 MG 24 hr tablet TAKE 1.5 TABLETS EVERY MORNING AND 0.5 TAB EVERY EVENING TAKE WITH OR IMMEDIATELY FOLLOWING A MEAL. 10/01/23   Sharlene Dory, PA-C  Multiple Vitamins-Minerals (MULTIVITAMIN WITH MINERALS) tablet Take 1 tablet by mouth 2 (two) times a week.     [provider]  sildenafil (REVATIO) 20 MG tablet Take 20-100 mg by mouth daily as needed for erectile dysfunction. 05/22/19   [provider]  tamsulosin (FLOMAX) 0.4 MG CAPS capsule Take 0.4 mg by mouth daily. 09/26/23   [provider]    Family History    Family History  Problem Relation Age of Onset   Heart disease Father 38   Heart attack Father 63   Hypertension Mother 84   Heart attack Brother 29   Stroke Neg Hx    He indicated that his mother is alive. He indicated that his father is deceased. He indicated that his sister is alive. He indicated that three of his five brothers are alive. He indicated that his maternal grandmother is deceased. He indicated that his maternal grandfather is deceased. He indicated that his paternal grandmother is deceased. He indicated that his paternal grandfather is deceased. He indicated that the status of his neg hx is unknown.  Social History    Social History   Socioeconomic History   Marital status: Married    Spouse name: Not on file   Number of children: 3   Years of education: college   Highest education level: Not on file  Occupational History   Occupation: unemployed  Tobacco Use   Smoking status: Some Days    Types: Cigars, Cigarettes   Smokeless tobacco: Never    Tobacco comments:    03-13-2018 per pt quit cigarettes 1990s, but currently smokes occasional cigar  Vaping Use   Vaping status: Never Used  Substance and Sexual Activity   Alcohol use: Yes    Alcohol/week: 14.0 standard drinks of alcohol    Types: 14 Glasses of wine per week    Comment: average 2 wine daily   Drug use: No   Sexual activity: Not on file  Other Topics Concern   Not on file  Social History Narrative   Not on file   Social Drivers of Health   Financial Resource Strain: Not on file  Food Insecurity: Not on file  Transportation Needs: Not on file  Physical Activity: Not on file  Stress: Not on file  Social Connections: Not on file  Intimate Partner Violence: Not on file     Review of Systems    General:  No chills, fever, night sweats or weight changes.  Cardiovascular:  No chest pain, dyspnea on exertion, edema, orthopnea, palpitations, paroxysmal nocturnal dyspnea. Dermatological: No rash, lesions/masses Respiratory: No cough, dyspnea Urologic: No hematuria, dysuria Abdominal:   No nausea, vomiting, diarrhea, bright red blood per rectum, melena, or hematemesis Neurologic:  No visual changes, wkns, changes in mental status. All other systems reviewed and are otherwise negative except as noted above.  Physical Exam    VS:  BP 130/80 (BP Location: Right Arm, Patient Position: Sitting, Cuff Size: Normal)   Pulse 96   Ht 5\' 9"  (1.753 m)   Wt 178 lb 6.4 oz (80.9 kg)   SpO2 98%   BMI 26.35 kg/m  , BMI Body mass index is 26.35 kg/m. GEN: Well nourished, well developed, in no acute distress. HEENT: normal. Neck: Supple, no JVD, carotid bruits, or masses. Cardiac: RRR, no murmurs, rubs, or gallops. No clubbing, cyanosis, edema.  Radials/DP/PT 2+ and equal bilaterally.  Respiratory:  Respirations regular and unlabored, clear to auscultation bilaterally. GI: Soft, nontender, nondistended, BS + x 4. MS: no deformity or atrophy.  Right hip pain Skin: warm and  dry, no rash. Neuro:  Strength and sensation are intact. Psych: Normal affect.  Accessory Clinical Findings    Recent Labs: No results found for requested labs within last 365 days.   Recent Lipid Panel No results found for: "CHOL", "TRIG", "HDL", "CHOLHDL", "VLDL", "LDLCALC", "LDLDIRECT"       ECG personally reviewed by me today- EKG Interpretation Date/Time:  Monday January 06 2024 09:01:26 EST Ventricular Rate:  96 PR Interval:    QRS Duration:  74 QT Interval:  350 QTC Calculation: 442 R Axis:   153  Text Interpretation: Atrial fibrillation Right axis deviation When compared with ECG of 01-Oct-2023 08:11, No significant change was found Confirmed by Edd Fabian [phone]) on 01/06/2024 9:10:22 AM    Echocardiogram 09/27/2015  Study Conclusions   - Left ventricle: The cavity size was normal. There was mild focal    basal hypertrophy of the septum. Systolic function was normal.    The estimated ejection fraction was in the range of 60% to 65%.    Wall motion was normal; there were no regional wall motion    abnormalities.  - Aortic valve: There was trivial regurgitation.  - Mitral valve: There was mild regurgitation.  - Left atrium: The atrium was severely dilated.  - Right ventricle: The cavity size was normal. Wall thickness was    normal. Systolic function was normal.  - Inferior vena cava: The vessel was normal in size. The    respirophasic diameter changes were in the normal range (>= 50%),    consistent with normal central venous pressure.   Transthoracic echocardiography.  M-mode, complete 2D, spectral  Doppler, and color Doppler.  Birthdate:  Patient birthdate:  Dec 17, 1945.  Age:  Patient is 78 yr old.  Sex:  Gender: male.  BMI: 26.6 kg/m^2.  Blood pressure:     138/86  Patient status:  Outpatient.  Study date:  Study date: 09/27/2015. Study time: 10:29  AM.  Location:  [place] [facility]   -------------------------------------------------------------------    -------------------------------------------------------------------  Left ventricle:  The cavity size was normal. There was mild focal  basal hypertrophy of the septum. Systolic function was normal. The  estimated ejection fraction was in the range of 60% to 65%. Wall  motion was normal; there were no regional wall motion  abnormalities. The study was not technically sufficient to  allow  evaluation of LV diastolic dysfunction due to atrial fibrillation.    -------------------------------------------------------------------  Aortic valve:   Trileaflet; normal thickness leaflets. Mobility was  not restricted.  Doppler:  Transvalvular velocity was within the  normal range. There was no stenosis. There was trivial  regurgitation.   -------------------------------------------------------------------  Aorta: Aortic root: The aortic root was normal in size.   -------------------------------------------------------------------  Mitral valve:   Structurally normal valve.   Mobility was not  restricted.  Doppler:  Transvalvular velocity was within the normal  range. There was no evidence for stenosis. There was mild  regurgitation.    Peak gradient (D): 4 mm Hg.   -------------------------------------------------------------------  Left atrium:  The atrium was severely dilated.   -------------------------------------------------------------------  Right ventricle:  The cavity size was normal. Wall thickness was  normal. Systolic function was normal.   -------------------------------------------------------------------  Pulmonic valve:    Structurally normal valve.   Cusp separation was  normal.  Doppler:  Transvalvular velocity was within the normal  range. There was no evidence for stenosis. There was no  regurgitation.   -------------------------------------------------------------------  Tricuspid valve:   Structurally normal valve.    Doppler:  Transvalvular velocity was within  the normal range. There was no  regurgitation.   -------------------------------------------------------------------  Pulmonary artery:   The main pulmonary artery was normal-sized.  Systolic pressure was within the normal range.   -------------------------------------------------------------------  Right atrium:  The atrium was normal in size.   -------------------------------------------------------------------  Pericardium: There was no pericardial effusion.   -------------------------------------------------------------------  Systemic veins:  Inferior vena cava: The vessel was normal in size. The  respirophasic diameter changes were in the normal range (>= 50%),  consistent with normal central venous pressure.       Assessment & Plan   1.  Essential hypertension-BP today 130/80. Heart healthy low-sodium diet Continue metoprolol Maintain blood pressure log  Orthostatic hypotension-resolved.  Occasionally continues to notice dizziness with standing. Maintain p.o. hydration Change positions slowly Lower extremity support stockings  Atrial fibrillation-EKG today shows atrial fibrillation 96 bpm.  Reports compliance with apixaban.  Denies bleeding issues. Avoid triggers caffeine, chocolate, EtOH, dehydration etc. Continue metoprolol, apixaban  Preoperative cardiac evaluation-Procedure:  RIGHT TOTAL HIP REPLACEMENT   Date of Surgery:  Clearance TBD                                  Surgeon:  DR. Margarita Rana Surgeon's Group or Practice Name:  [facility] Phone number:  [phone] [address] Fax number:  [phone]     Primary Cardiologist: Armanda Magic, MD  Chart reviewed as part of pre-operative protocol coverage. Given past medical history and time since last visit, based on ACC/AHA guidelines, Scott Newton would be at acceptable risk for the planned procedure without further cardiovascular testing.   His RCRI is very low risk, 0.4% risk  of major cardiac event.  He is able to complete greater than 4 METS of physical activity.   Per office protocol, patient can hold Eliquis for 3 days prior to procedure.   Patient was advised that if he develops new symptoms prior to surgery to contact our office to arrange a follow-up appointment.  He verbalized understanding.  I will route this recommendation to the requesting party via Epic fax function and remove from pre-op pool.   Disposition: Follow-up with Dr. Mayford Knife or me in 9-12 months.  Scott Ripple. Merrik Puebla NP-C     01/06/2024, 9:23 AM [facility] [address] Office [phone] Fax [phone]    I spent 14 minutes examining this patient, reviewing medications, and using patient centered shared decision making involving their cardiac care.   I spent  20 minutes reviewing past medical history,  medications, and prior cardiac tests.

## 2024-01-06 ENCOUNTER — Ambulatory Visit: Payer: PPO | Attending: General Practice | Admitting: General Practice

## 2024-01-06 ENCOUNTER — Encounter: Payer: Self-pay | Admitting: General Practice

## 2024-01-06 VITALS — BP 130/80 | HR 96 | Ht 69.0 in | Wt 178.4 lb

## 2024-01-06 DIAGNOSIS — I951 Orthostatic hypotension: Secondary | ICD-10-CM

## 2024-01-06 DIAGNOSIS — I482 Chronic atrial fibrillation, unspecified: Secondary | ICD-10-CM | POA: Diagnosis not present

## 2024-01-06 DIAGNOSIS — Z01810 Encounter for preprocedural cardiovascular examination: Secondary | ICD-10-CM | POA: Diagnosis not present

## 2024-01-06 DIAGNOSIS — I10 Essential (primary) hypertension: Secondary | ICD-10-CM

## 2024-01-06 NOTE — Patient Instructions (Signed)
 Medication Instructions:  The current medical regimen is effective;  continue present plan and medications as directed. Please refer to the Current Medication list given to you today.  *If you need a refill on your cardiac medications before your next appointment, please call your pharmacy*  Lab Work: NONE If you have labs (blood work) drawn today and your tests are completely normal, you will receive your results only by:  MyChart Message (if you have MyChart) OR A paper copy in the mail If you have any lab test that is abnormal or we need to change your treatment, we will call you to review the results.  Other Instructions CLEARED FOR SURGERY  Follow-Up: At [facility], you and your health needs are our priority.  As part of our continuing mission to provide you with exceptional heart care, we have created designated Provider Care Teams.  These Care Teams include your primary Cardiologist (physician) and Advanced Practice Providers (APPs -  Physician Assistants and Nurse Practitioners) who all work together to provide you with the care you need, when you need it.  Your next appointment:   12 month(s)  Provider:   Armanda Magic, MD

## 2024-01-22 DIAGNOSIS — M1611 Unilateral primary osteoarthritis, right hip: Secondary | ICD-10-CM | POA: Diagnosis not present

## 2024-02-13 DIAGNOSIS — M1611 Unilateral primary osteoarthritis, right hip: Secondary | ICD-10-CM | POA: Diagnosis not present

## 2024-02-17 DIAGNOSIS — R262 Difficulty in walking, not elsewhere classified: Secondary | ICD-10-CM | POA: Diagnosis not present

## 2024-02-17 DIAGNOSIS — M1611 Unilateral primary osteoarthritis, right hip: Secondary | ICD-10-CM | POA: Diagnosis not present

## 2024-02-17 DIAGNOSIS — M6281 Muscle weakness (generalized): Secondary | ICD-10-CM | POA: Diagnosis not present

## 2024-02-17 DIAGNOSIS — M25651 Stiffness of right hip, not elsewhere classified: Secondary | ICD-10-CM | POA: Diagnosis not present

## 2024-02-24 DIAGNOSIS — M6281 Muscle weakness (generalized): Secondary | ICD-10-CM | POA: Diagnosis not present

## 2024-02-24 DIAGNOSIS — M1611 Unilateral primary osteoarthritis, right hip: Secondary | ICD-10-CM | POA: Diagnosis not present

## 2024-02-24 DIAGNOSIS — R262 Difficulty in walking, not elsewhere classified: Secondary | ICD-10-CM | POA: Diagnosis not present

## 2024-02-24 DIAGNOSIS — M25651 Stiffness of right hip, not elsewhere classified: Secondary | ICD-10-CM | POA: Diagnosis not present

## 2024-02-26 DIAGNOSIS — M1611 Unilateral primary osteoarthritis, right hip: Secondary | ICD-10-CM | POA: Diagnosis not present

## 2024-03-04 DIAGNOSIS — M1611 Unilateral primary osteoarthritis, right hip: Secondary | ICD-10-CM | POA: Diagnosis not present

## 2024-03-04 DIAGNOSIS — M6281 Muscle weakness (generalized): Secondary | ICD-10-CM | POA: Diagnosis not present

## 2024-03-04 DIAGNOSIS — R262 Difficulty in walking, not elsewhere classified: Secondary | ICD-10-CM | POA: Diagnosis not present

## 2024-03-04 DIAGNOSIS — M25651 Stiffness of right hip, not elsewhere classified: Secondary | ICD-10-CM | POA: Diagnosis not present

## 2024-03-17 DIAGNOSIS — R262 Difficulty in walking, not elsewhere classified: Secondary | ICD-10-CM | POA: Diagnosis not present

## 2024-03-17 DIAGNOSIS — M1611 Unilateral primary osteoarthritis, right hip: Secondary | ICD-10-CM | POA: Diagnosis not present

## 2024-03-17 DIAGNOSIS — M25651 Stiffness of right hip, not elsewhere classified: Secondary | ICD-10-CM | POA: Diagnosis not present

## 2024-03-17 DIAGNOSIS — M6281 Muscle weakness (generalized): Secondary | ICD-10-CM | POA: Diagnosis not present

## 2024-03-25 DIAGNOSIS — R262 Difficulty in walking, not elsewhere classified: Secondary | ICD-10-CM | POA: Diagnosis not present

## 2024-03-25 DIAGNOSIS — M6281 Muscle weakness (generalized): Secondary | ICD-10-CM | POA: Diagnosis not present

## 2024-03-25 DIAGNOSIS — M1611 Unilateral primary osteoarthritis, right hip: Secondary | ICD-10-CM | POA: Diagnosis not present

## 2024-03-25 DIAGNOSIS — M25651 Stiffness of right hip, not elsewhere classified: Secondary | ICD-10-CM | POA: Diagnosis not present

## 2024-04-01 DIAGNOSIS — M25651 Stiffness of right hip, not elsewhere classified: Secondary | ICD-10-CM | POA: Diagnosis not present

## 2024-04-01 DIAGNOSIS — R262 Difficulty in walking, not elsewhere classified: Secondary | ICD-10-CM | POA: Diagnosis not present

## 2024-04-01 DIAGNOSIS — M6281 Muscle weakness (generalized): Secondary | ICD-10-CM | POA: Diagnosis not present

## 2024-04-01 DIAGNOSIS — M1611 Unilateral primary osteoarthritis, right hip: Secondary | ICD-10-CM | POA: Diagnosis not present

## 2024-04-30 DIAGNOSIS — L814 Other melanin hyperpigmentation: Secondary | ICD-10-CM | POA: Diagnosis not present

## 2024-04-30 DIAGNOSIS — D225 Melanocytic nevi of trunk: Secondary | ICD-10-CM | POA: Diagnosis not present

## 2024-04-30 DIAGNOSIS — L570 Actinic keratosis: Secondary | ICD-10-CM | POA: Diagnosis not present

## 2024-04-30 DIAGNOSIS — L821 Other seborrheic keratosis: Secondary | ICD-10-CM | POA: Diagnosis not present

## 2024-06-12 DIAGNOSIS — T148XXA Other injury of unspecified body region, initial encounter: Secondary | ICD-10-CM | POA: Diagnosis not present

## 2024-06-12 DIAGNOSIS — I4891 Unspecified atrial fibrillation: Secondary | ICD-10-CM | POA: Diagnosis not present

## 2024-06-12 DIAGNOSIS — R319 Hematuria, unspecified: Secondary | ICD-10-CM | POA: Diagnosis not present

## 2024-07-01 ENCOUNTER — Inpatient Hospital Stay

## 2024-07-01 ENCOUNTER — Inpatient Hospital Stay: Attending: Hematology and Oncology | Admitting: Hematology and Oncology

## 2024-07-01 VITALS — BP 132/68 | HR 83 | Temp 98.3°F | Resp 18 | Ht 69.0 in | Wt 166.5 lb

## 2024-07-01 DIAGNOSIS — Z809 Family history of malignant neoplasm, unspecified: Secondary | ICD-10-CM | POA: Diagnosis not present

## 2024-07-01 DIAGNOSIS — Z7901 Long term (current) use of anticoagulants: Secondary | ICD-10-CM | POA: Insufficient documentation

## 2024-07-01 DIAGNOSIS — D696 Thrombocytopenia, unspecified: Secondary | ICD-10-CM

## 2024-07-01 DIAGNOSIS — Z8042 Family history of malignant neoplasm of prostate: Secondary | ICD-10-CM | POA: Diagnosis not present

## 2024-07-01 LAB — VITAMIN B12: Vitamin B-12: 192 pg/mL (ref 180–914)

## 2024-07-01 LAB — CBC WITH DIFFERENTIAL (CANCER CENTER ONLY)
Abs Immature Granulocytes: 0.01 K/uL (ref 0.00–0.07)
Basophils Absolute: 0 K/uL (ref 0.0–0.1)
Basophils Relative: 1 %
Eosinophils Absolute: 0.1 K/uL (ref 0.0–0.5)
Eosinophils Relative: 3 %
HCT: 44.4 % (ref 39.0–52.0)
Hemoglobin: 14.7 g/dL (ref 13.0–17.0)
Immature Granulocytes: 0 %
Lymphocytes Relative: 29 %
Lymphs Abs: 1.4 K/uL (ref 0.7–4.0)
MCH: 29.5 pg (ref 26.0–34.0)
MCHC: 33.1 g/dL (ref 30.0–36.0)
MCV: 89 fL (ref 80.0–100.0)
Monocytes Absolute: 0.7 K/uL (ref 0.1–1.0)
Monocytes Relative: 14 %
Neutro Abs: 2.7 K/uL (ref 1.7–7.7)
Neutrophils Relative %: 53 %
Platelet Count: 150 K/uL (ref 150–400)
RBC: 4.99 MIL/uL (ref 4.22–5.81)
RDW: 13.2 % (ref 11.5–15.5)
WBC Count: 5 K/uL (ref 4.0–10.5)
nRBC: 0 % (ref 0.0–0.2)

## 2024-07-01 LAB — IMMATURE PLATELET FRACTION: Immature Platelet Fraction: 2.5 % (ref 1.2–8.6)

## 2024-07-01 NOTE — Progress Notes (Signed)
 Patient Care Team: Kip Righter, MD as PCP - General (Family Medicine) Shlomo Wilbert SAUNDERS, MD as PCP - Cardiology (Cardiology)  DIAGNOSIS:  Encounter Diagnosis  Name Primary?   Thrombocytopenia (HCC) Yes    CHIEF COMPLIANT: Thrombocytopenia evaluation  HISTORY OF PRESENT ILLNESS:  History of Present Illness  Scott Newton is a 78 year old male who presents with low platelet count and bruising.  His platelet count decreased from 211 in November 2024 to 136 in July 2025. Bruising has been present for about a week, typically lasting a week to ten days, and may be exacerbated by minor trauma. He is on Eliquis  for five to six years.  He underwent hip replacement surgery four months ago and experiences occasional popping sounds from the hip area.  He has a family history of cancer, with two brothers who died from cancer-related causes. His youngest brother had prostate cancer with bone metastasis.     ALLERGIES:  has no known allergies.  MEDICATIONS:  Current Outpatient Medications  Medication Sig Dispense Refill   acetaminophen  (TYLENOL ) 500 MG tablet Take 500 mg by mouth every 6 (six) hours as needed for moderate pain or headache.     apixaban  (ELIQUIS ) 5 MG TABS tablet Take 1 tablet (5 mg total) by mouth 2 (two) times daily. 180 tablet 3   finasteride (PROSCAR) 5 MG tablet Take 5 mg by mouth daily.     Magnesium 250 MG TABS Take 250 mg by mouth daily at 6 (six) AM.     metoprolol  succinate (TOPROL -XL) 50 MG 24 hr tablet TAKE 1.5 TABLETS EVERY MORNING AND 0.5 TAB EVERY EVENING TAKE WITH OR IMMEDIATELY FOLLOWING A MEAL. 180 tablet 3   Multiple Vitamins-Minerals (MULTIVITAMIN WITH MINERALS) tablet Take 1 tablet by mouth 2 (two) times a week.      sildenafil (REVATIO) 20 MG tablet Take 20-100 mg by mouth daily as needed for erectile dysfunction.     tamsulosin (FLOMAX) 0.4 MG CAPS capsule Take 0.4 mg by mouth daily.     No current facility-administered medications for this visit.     PHYSICAL EXAMINATION: ECOG PERFORMANCE STATUS: 1 - Symptomatic but completely ambulatory  Vitals:   07/01/24 1343  BP: 132/68  Pulse: 83  Resp: 18  Temp: 98.3 F (36.8 C)  SpO2: 98%   Filed Weights   07/01/24 1343  Weight: 166 lb 8 oz (75.5 kg)    Physical Exam   (exam performed in the presence of a chaperone)  LABORATORY DATA:  I have reviewed the data as listed    Latest Ref Rng & Units 09/14/2021    8:57 AM 09/06/2020    9:43 AM 09/09/2019    9:32 AM  CMP  Glucose 70 - 99 mg/dL 78  96  95   BUN 8 - 27 mg/dL 14  14  20    Creatinine 0.76 - 1.27 mg/dL 9.06  8.98  9.00   Sodium 134 - 144 mmol/L 141  139  137   Potassium 3.5 - 5.2 mmol/L 4.6  4.2  4.6   Chloride 96 - 106 mmol/L 103  101  103   CO2 20 - 29 mmol/L 26  25  25    Calcium 8.6 - 10.2 mg/dL 9.0  9.1  8.7     Lab Results  Component Value Date   WBC 4.3 09/14/2021   HGB 15.3 09/14/2021   HCT 46.3 09/14/2021   MCV 91 09/14/2021   PLT 200 09/14/2021   NEUTROABS 2.7  04/22/2017    ASSESSMENT & PLAN:  Thrombocytopenia (HCC) Lab review: 09/25/2023: WBC 5.5, hemoglobin 15.1, platelets 211 06/12/2024: WBC 3.8, hemoglobin 14.4, Platelets 136  Mild thrombocytopenia: I discussed with the patient that although the normal range for platelets is 150-400, platelet count 136 is considered to be a fairly mild decrease in the platelet count.  However because his counts have come down from 211-136, we will recheck the CBC to make sure there is no platelet clumping.  Will also check for immature platelet fraction to differentiate between decreased production versus increased consumption.  I reviewed the different causes of low platelets and felt that we will do the due diligence to make sure that he does not have progressively worsening thrombocytopenia.  If his platelets drop to below 100 then we would most likely need to do a bone marrow biopsy. There is no clear-cut evidence of bone marrow dysfunction based upon  normal rest of the blood parameters.  I will call him tomorrow to discuss the results of his blood work from today. Patient informed me that both of his brothers have passed away and was very emotional about his brother's cancer journey. ------------------------------------- Assessment and Plan Assessment & Plan Mild thrombocytopenia with associated bruising and easy bleeding Mild thrombocytopenia with platelet count of 136, decreased from 211. Bruising and bleeding likely exacerbated by Eliquis . Differential includes primary bone marrow defect, increased consumption, immune-mediated destruction, medication effects, and lab error. Recent surgery and urinary issues may contribute. No family history of bleeding disorders. No evidence of medication-induced dysfunction from tamsulosin and finasteride. - Order repeat platelet count and immature platelet fraction. - Review blood work results by end of day or tomorrow and discuss findings. - Consider bone marrow biopsy if platelet count drops further. - Continue Eliquis  for irregular heart rate.      No orders of the defined types were placed in this encounter.  The patient has a good understanding of the overall plan. he agrees with it. he will call with any problems that may develop before the next visit here. Total time spent: 45 mins including face to face time and time spent for planning, charting and co-ordination of care   Naomi MARLA Chad, MD 07/01/24

## 2024-07-01 NOTE — Assessment & Plan Note (Signed)
 Lab review: 09/25/2023: WBC 5.5, hemoglobin 15.1, platelets 211 06/12/2024: WBC 3.8, hemoglobin 14.4, Platelets 136  Mild thrombocytopenia: I discussed with the patient that although the normal range for platelets is 150-400, platelet count 136 does not warrant any additional workup and it is considered to be within clinically normal range. I explained to him that platelet counts can fluctuate on a day-to-day basis.  We broadly discussed the different causes of thrombocytopenia including different causes of decreased production and increased consumption. Since the platelet count is not clinically significantly low, we can continue to watch and monitor. We would like to see him back if his platelet counts drop below 100.

## 2024-07-16 DIAGNOSIS — N401 Enlarged prostate with lower urinary tract symptoms: Secondary | ICD-10-CM | POA: Diagnosis not present

## 2024-07-16 DIAGNOSIS — N323 Diverticulum of bladder: Secondary | ICD-10-CM | POA: Diagnosis not present

## 2024-07-16 DIAGNOSIS — R310 Gross hematuria: Secondary | ICD-10-CM | POA: Diagnosis not present

## 2024-07-16 DIAGNOSIS — R3914 Feeling of incomplete bladder emptying: Secondary | ICD-10-CM | POA: Diagnosis not present

## 2024-08-05 DIAGNOSIS — C771 Secondary and unspecified malignant neoplasm of intrathoracic lymph nodes: Secondary | ICD-10-CM | POA: Diagnosis not present

## 2024-08-05 DIAGNOSIS — R599 Enlarged lymph nodes, unspecified: Secondary | ICD-10-CM | POA: Diagnosis not present

## 2024-08-05 DIAGNOSIS — R310 Gross hematuria: Secondary | ICD-10-CM | POA: Diagnosis not present

## 2024-08-05 DIAGNOSIS — R161 Splenomegaly, not elsewhere classified: Secondary | ICD-10-CM | POA: Diagnosis not present

## 2024-08-13 DIAGNOSIS — J019 Acute sinusitis, unspecified: Secondary | ICD-10-CM | POA: Diagnosis not present

## 2024-08-13 DIAGNOSIS — J40 Bronchitis, not specified as acute or chronic: Secondary | ICD-10-CM | POA: Diagnosis not present

## 2024-08-18 ENCOUNTER — Ambulatory Visit: Payer: Self-pay | Admitting: Hematology and Oncology

## 2024-08-19 ENCOUNTER — Inpatient Hospital Stay
Admission: RE | Admit: 2024-08-19 | Discharge: 2024-08-19 | Disposition: A | Discharge status: Home / Self Care | Payer: Self-pay | Source: Ambulatory Visit | Attending: Hematology and Oncology | Admitting: Hematology and Oncology

## 2024-08-19 ENCOUNTER — Other Ambulatory Visit: Payer: Self-pay | Admitting: *Deleted

## 2024-08-19 DIAGNOSIS — R599 Enlarged lymph nodes, unspecified: Secondary | ICD-10-CM

## 2024-08-19 NOTE — Progress Notes (Signed)
 Received CT abdomen/ pelvis report from [facility] showing innumerable enlarged lymph nodes throughout the abdomen and lower chest.  RN reviewed with MD and verbal orders received and placed to obtain CT Chest and CT neck for further evaluation and possible biopsy.  Orders placed, appt scheduled, pt notified and verbalized understanding.

## 2024-08-26 ENCOUNTER — Ambulatory Visit (HOSPITAL_COMMUNITY)
Admission: RE | Admit: 2024-08-26 | Discharge: 2024-08-26 | Disposition: A | Discharge status: Home / Self Care | Source: Ambulatory Visit | Attending: Hematology and Oncology | Admitting: Hematology and Oncology

## 2024-08-26 DIAGNOSIS — R599 Enlarged lymph nodes, unspecified: Secondary | ICD-10-CM | POA: Diagnosis not present

## 2024-08-26 DIAGNOSIS — R918 Other nonspecific abnormal finding of lung field: Secondary | ICD-10-CM | POA: Diagnosis not present

## 2024-08-26 DIAGNOSIS — R161 Splenomegaly, not elsewhere classified: Secondary | ICD-10-CM | POA: Diagnosis not present

## 2024-08-26 DIAGNOSIS — R590 Localized enlarged lymph nodes: Secondary | ICD-10-CM

## 2024-08-26 DIAGNOSIS — R591 Generalized enlarged lymph nodes: Secondary | ICD-10-CM | POA: Diagnosis not present

## 2024-08-26 MED ORDER — IOHEXOL 300 MG/ML  SOLN
75.0000 mL | Freq: Once | INTRAMUSCULAR | Status: AC | PRN
  Administered 2024-08-26: 75 mL via INTRAVENOUS

## 2024-08-26 MED ORDER — SODIUM CHLORIDE (PF) 0.9 % IJ SOLN
INTRAMUSCULAR | Status: AC
  Filled 2024-08-26: qty 50

## 2024-08-28 ENCOUNTER — Encounter: Payer: Self-pay | Admitting: *Deleted

## 2024-08-28 ENCOUNTER — Other Ambulatory Visit: Payer: Self-pay | Admitting: *Deleted

## 2024-08-28 DIAGNOSIS — R599 Enlarged lymph nodes, unspecified: Secondary | ICD-10-CM

## 2024-08-28 NOTE — Progress Notes (Signed)
 Per MD request RN placed orders for US  guided biopsy of axilla lymph nodes.  RN attempt x1 to contact pt. No answer, LVM for pt to return call to the office.

## 2024-09-01 ENCOUNTER — Encounter (HOSPITAL_COMMUNITY): Payer: Self-pay

## 2024-09-01 NOTE — Progress Notes (Signed)
 Jenna Cordella LABOR, MD  Michaelene Setter PROCEDURE / BIOPSY REVIEW Date: 08/31/24  Requested Biopsy site: neck lymphadenopathy bilaterl Reason for request: Neck lymph node core biopsy Imaging review: Best seen on CT neck  Decision: Approved Imaging modality to perform: Ultrasound Schedule with: No sedation / Local anesthetic Schedule for: Any VIR  Additional comments: @Schedulers .  Please contact me with questions, concerns, or if issue pertaining to this request arise.  Cordella LABOR Jenna, MD [facility]       Previous Messages    ----- Message ----- From: Markia Kyer Sent: 08/31/2024   3:20 PM EDT To: Ir Procedure Requests Subject: FW: CT biopsy                                   ----- Message ----- From: Kenneth Lax Sent: 08/28/2024   8:59 AM EDT To: Chanita Boden; Ir Procedure Requests Subject: CT biopsy                                      Procedure : CT Biopsy  Reason: Multiple enlarged mediastinal, bilateral hilar and axillary lymph nodes Dx: Enlarged lymph nodes [R59.9 (ICD-10-CM)]    History :CT Chest w/ contrast , CT Soft tissue neck w/  Provider : Odean Potts, MD  Contact : [phone]

## 2024-09-02 ENCOUNTER — Ambulatory Visit: Admitting: Hematology and Oncology

## 2024-09-09 ENCOUNTER — Other Ambulatory Visit: Payer: Self-pay | Admitting: Hematology and Oncology

## 2024-09-09 ENCOUNTER — Ambulatory Visit (HOSPITAL_COMMUNITY)
Admission: RE | Admit: 2024-09-09 | Discharge: 2024-09-09 | Disposition: A | Discharge status: Home / Self Care | Source: Ambulatory Visit | Attending: Hematology and Oncology | Admitting: Hematology and Oncology

## 2024-09-09 DIAGNOSIS — R591 Generalized enlarged lymph nodes: Secondary | ICD-10-CM | POA: Diagnosis not present

## 2024-09-09 DIAGNOSIS — C8510 Unspecified B-cell lymphoma, unspecified site: Secondary | ICD-10-CM | POA: Insufficient documentation

## 2024-09-09 DIAGNOSIS — C8514 Unspecified B-cell lymphoma, lymph nodes of axilla and upper limb: Secondary | ICD-10-CM | POA: Diagnosis not present

## 2024-09-09 DIAGNOSIS — R599 Enlarged lymph nodes, unspecified: Secondary | ICD-10-CM

## 2024-09-09 HISTORY — PX: IR US LIVER BIOPSY: IMG936

## 2024-09-09 MED ORDER — LIDOCAINE HCL 1 % IJ SOLN
INTRAMUSCULAR | Status: AC
Start: 2024-09-09 — End: 2024-09-09
  Filled 2024-09-09: qty 20

## 2024-09-09 MED ORDER — LIDOCAINE HCL 1 % IJ SOLN
20.0000 mL | Freq: Once | INTRAMUSCULAR | Status: AC
Start: 2024-09-09 — End: 2024-09-09
  Administered 2024-09-09: 10 mL via INTRADERMAL

## 2024-09-09 NOTE — Procedures (Signed)
 Interventional Radiology Procedure:   Indications: Lymphadenopathy  Procedure: US  guided left axillary lymph node biopsy  Findings: Multiple core biopsies from enlarged left axillary node.  Specimens placed on Telfa with saline.    Complications: None     EBL: Minimal   Plan:  Discharge to home   Cheo Selvey R. Philip, MD  Pager: [phone]

## 2024-09-15 LAB — SURGICAL PATHOLOGY

## 2024-09-16 ENCOUNTER — Inpatient Hospital Stay: Attending: Hematology and Oncology | Admitting: Hematology and Oncology

## 2024-09-16 ENCOUNTER — Encounter: Payer: Self-pay | Admitting: *Deleted

## 2024-09-16 ENCOUNTER — Other Ambulatory Visit: Payer: Self-pay | Admitting: *Deleted

## 2024-09-16 ENCOUNTER — Inpatient Hospital Stay

## 2024-09-16 VITALS — BP 120/84 | HR 88 | Temp 98.7°F | Resp 18 | Ht 69.0 in | Wt 159.0 lb

## 2024-09-16 DIAGNOSIS — D696 Thrombocytopenia, unspecified: Secondary | ICD-10-CM

## 2024-09-16 DIAGNOSIS — C8312 Mantle cell lymphoma, intrathoracic lymph nodes: Secondary | ICD-10-CM | POA: Diagnosis not present

## 2024-09-16 DIAGNOSIS — Z7901 Long term (current) use of anticoagulants: Secondary | ICD-10-CM | POA: Diagnosis not present

## 2024-09-16 DIAGNOSIS — Z79899 Other long term (current) drug therapy: Secondary | ICD-10-CM | POA: Insufficient documentation

## 2024-09-16 DIAGNOSIS — R591 Generalized enlarged lymph nodes: Secondary | ICD-10-CM | POA: Diagnosis not present

## 2024-09-16 LAB — CBC WITH DIFFERENTIAL (CANCER CENTER ONLY)
Abs Immature Granulocytes: 0.03 K/uL (ref 0.00–0.07)
Basophils Absolute: 0 K/uL (ref 0.0–0.1)
Basophils Relative: 0 %
Eosinophils Absolute: 0.1 K/uL (ref 0.0–0.5)
Eosinophils Relative: 2 %
HCT: 43.5 % (ref 39.0–52.0)
Hemoglobin: 14.4 g/dL (ref 13.0–17.0)
Immature Granulocytes: 1 %
Lymphocytes Relative: 21 %
Lymphs Abs: 1 K/uL (ref 0.7–4.0)
MCH: 29.4 pg (ref 26.0–34.0)
MCHC: 33.1 g/dL (ref 30.0–36.0)
MCV: 89 fL (ref 80.0–100.0)
Monocytes Absolute: 0.6 K/uL (ref 0.1–1.0)
Monocytes Relative: 14 %
Neutro Abs: 2.8 K/uL (ref 1.7–7.7)
Neutrophils Relative %: 62 %
Platelet Count: 131 K/uL — ABNORMAL LOW (ref 150–400)
RBC: 4.89 MIL/uL (ref 4.22–5.81)
RDW: 13.5 % (ref 11.5–15.5)
WBC Count: 4.6 K/uL (ref 4.0–10.5)
nRBC: 0 % (ref 0.0–0.2)

## 2024-09-16 LAB — CMP (CANCER CENTER ONLY)
ALT: 19 U/L (ref 0–44)
AST: 29 U/L (ref 15–41)
Albumin: 4.2 g/dL (ref 3.5–5.0)
Alkaline Phosphatase: 133 U/L — ABNORMAL HIGH (ref 38–126)
Anion gap: 5 (ref 5–15)
BUN: 13 mg/dL (ref 8–23)
CO2: 30 mmol/L (ref 22–32)
Calcium: 9.6 mg/dL (ref 8.9–10.3)
Chloride: 103 mmol/L (ref 98–111)
Creatinine: 0.88 mg/dL (ref 0.61–1.24)
GFR, Estimated: >60 mL/min (ref >60)
Glucose, Bld: 91 mg/dL (ref 70–99)
Potassium: 4.5 mmol/L (ref 3.5–5.1)
Sodium: 138 mmol/L (ref 135–145)
Total Bilirubin: 0.7 mg/dL (ref 0.0–1.2)
Total Protein: 7.8 g/dL (ref 6.5–8.1)

## 2024-09-16 LAB — HEPATITIS B SURFACE ANTIGEN: Hepatitis B Surface Ag: NONREACTIVE

## 2024-09-16 LAB — HEPATITIS C ANTIBODY: HCV Ab: NONREACTIVE

## 2024-09-16 LAB — HIV ANTIBODY (ROUTINE TESTING W REFLEX): HIV Screen 4th Generation wRfx: NONREACTIVE

## 2024-09-16 LAB — LACTATE DEHYDROGENASE: LDH: 231 U/L — ABNORMAL HIGH (ref 98–192)

## 2024-09-16 NOTE — Progress Notes (Signed)
 Patient Care Team: Kip Righter, MD as PCP - General (Family Medicine) Shlomo Wilbert SAUNDERS, MD as PCP - Cardiology (Cardiology) Odean Potts, MD as Consulting Physician (Hematology and Oncology)  DIAGNOSIS:  Encounter Diagnoses  Name Primary?   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  He has mantle cell lymphoma, confirmed by lymph node biopsy. He experienced weight loss from 170 pounds to 158 pounds over several weeks, with recent stabilization. He denies night sweats, fevers, or painful lymph nodes. Appetite is low.  A CT scan in September showed enlarged lymph nodes, Previous imaging focused on his hip did not mention lymphadenopathy.     ALLERGIES:  has no known allergies.  MEDICATIONS:  Current Outpatient Medications  Medication Sig Dispense Refill   acetaminophen  (TYLENOL ) 500 MG tablet Take 500 mg by mouth every 6 (six) hours as needed for moderate pain or headache.     apixaban  (ELIQUIS ) 5 MG TABS tablet Take 1 tablet (5 mg total) by mouth 2 (two) times daily. 180 tablet 3   finasteride (PROSCAR) 5 MG tablet Take 5 mg by mouth daily.     Magnesium 250 MG TABS Take 250 mg by mouth daily at 6 (six) AM.     Multiple Vitamins-Minerals (MULTIVITAMIN WITH MINERALS) tablet Take 1 tablet by mouth 2 (two) times a week.      sildenafil (REVATIO) 20 MG tablet Take 20-100 mg by mouth daily as needed for erectile dysfunction.     tamsulosin (FLOMAX) 0.4 MG CAPS capsule Take 0.4 mg by mouth daily.     metoprolol  succinate (TOPROL -XL) 50 MG 24 hr tablet TAKE 1.5 TABLETS EVERY MORNING AND 0.5 TAB EVERY EVENING TAKE WITH OR IMMEDIATELY FOLLOWING A MEAL. 180 tablet 3   No current  facility-administered medications for this visit.    PHYSICAL EXAMINATION: ECOG PERFORMANCE STATUS: 1 - Symptomatic but completely ambulatory  Vitals:   09/16/24 1353  BP: 120/84  Pulse: 88  Resp: 18  Temp: 98.7 F (37.1 C)  SpO2: 99%   Filed Weights   09/16/24 1353  Weight: 159 lb (72.1 kg)    Physical Exam MEASUREMENTS: Weight- 158 lbs.  (exam performed in the presence of a chaperone)  LABORATORY DATA:  I have reviewed the data as listed    Latest Ref Rng & Units 09/16/2024    3:13 PM 09/14/2021    8:57 AM 09/06/2020    9:43 AM  CMP  Glucose 70 - 99 mg/dL 91  78  96   BUN 8 - 23 mg/dL 13  14  14    Creatinine 0.61 - 1.24 mg/dL 9.11  9.06  8.98   Sodium 135 - 145 mmol/L 138  141  139   Potassium 3.5 - 5.1 mmol/L 4.5  4.6  4.2   Chloride 98 - 111 mmol/L 103  103  101   CO2 22 - 32 mmol/L 30  26  25    Calcium 8.9 - 10.3 mg/dL 9.6  9.0  9.1   Total Protein 6.5 - 8.1 g/dL 7.8     Total Bilirubin 0.0 - 1.2 mg/dL 0.7     Alkaline Phos 38 - 126 U/L 133     AST 15 - 41 U/L  29     ALT 0 - 44 U/L 19       Lab Results  Component Value Date   WBC 4.6 09/16/2024   HGB 14.4 09/16/2024   HCT 43.5 09/16/2024   MCV 89.0 09/16/2024   PLT 131 (L) 09/16/2024   NEUTROABS 2.8 09/16/2024    ASSESSMENT & PLAN:  Generalized lymphadenopathy CT chest 08/27/2024: Multiple enlarged mediastinal, bilateral hilar and axillary lymph nodes, multiple enlarged abdominal lymph nodes and enlarged spleen 09/09/2024: Left axillary lymph node biopsy: CD5 positive mature B-cell lymphoma CD20 positive, negative for CD10 and BCL6, flow cytometry: 60% lymphocytes CD5 positive lambda restricted B cells consistent with mantle cell lymphoma (Ki-67 is low less than 10%) FISH for CCND1-IgH gene rearrangement is pending  Discussion regarding mantle cell lymphoma: I discussed with the patient that mantle cell lymphoma is characterized by translocation of 11:14 and overexpression of cyclin D1.  Treatment  depends upon signs and symptoms as well as the pace of disease.  I would like to refer him to my partners who treat lymphoma so that they can discuss the pros and cons of treatment.  Plan: PET CT scan Bone marrow biopsy Hepatitis B and C testing and HIV testing along with LDH Follow-up with Dr. Onesimo in the next week or so to discuss the results and treatment       Orders Placed This Encounter  Procedures   NM PET Image Initial (PI) Skull Base To Thigh (F-18 FDG)    Standing Status:   Future    Expected Date:   09/23/2024    Expiration Date:   09/16/2025    If indicated for the ordered procedure, I authorize the administration of a radiopharmaceutical per Radiology protocol:   Yes    Preferred imaging location?:   [facility]    Release to patient:   Immediate   CBC with Differential (Cancer Center Only)    Standing Status:   Future    Number of Occurrences:   1    Expiration Date:   09/16/2025   CMP (Cancer Center only)    Standing Status:   Future    Number of Occurrences:   1    Expiration Date:   09/16/2025   Lactate dehydrogenase    Standing Status:   Future    Number of Occurrences:   1    Expiration Date:   09/16/2025   Hepatitis B surface antigen    Standing Status:   Future    Number of Occurrences:   1    Expiration Date:   09/16/2025   Hepatitis C antibody    Standing Status:   Future    Number of Occurrences:   1    Expiration Date:   09/16/2025   HIV antibody (with reflex)    Standing Status:   Future    Number of Occurrences:   1    Expiration Date:   09/16/2025   The patient has a good understanding of the overall plan. he agrees with it. he will call with any problems that may develop before the next visit here.  I personally spent a total of 45 minutes in the care of the patient today including preparing to see the patient, getting/reviewing separately obtained history, performing a medically appropriate exam/evaluation, counseling and educating, placing  orders, referring and communicating with other health care professionals, documenting clinical information in the EHR, independently interpreting results, communicating results, and coordinating care.   Viinay K Marx Doig, MD 09/16/24

## 2024-09-16 NOTE — Assessment & Plan Note (Signed)
 CT chest 08/27/2024: Multiple enlarged mediastinal, bilateral hilar and axillary lymph nodes, multiple enlarged abdominal lymph nodes and enlarged spleen 09/09/2024: Left axillary lymph node biopsy: CD5 positive mature B-cell lymphoma CD20 positive, negative for CD10 and BCL6, flow cytometry: 60% lymphocytes CD5 positive lambda restricted B cells consistent with mantle cell lymphoma (Ki-67 is low less than 10%) FISH for CCND1-IgH gene rearrangement is pending  Discussion regarding mantle cell lymphoma: I discussed with the patient that mantle cell lymphoma is characterized by translocation of 1114 and overexpression of cyclin D1.  Treatment depends upon signs and symptoms as well as the pace of disease.  I would like to refer him to my partners to treat lymphoma so that they can discuss the pros and cons of treatment.

## 2024-09-16 NOTE — Progress Notes (Signed)
 Per MD request, bone marrow biopsy scheduled for pt.  RN contacted pt with instructions.  Pt is on Eliquis  for A-fib, per MD no need to hold at this time. Pt educated and verbalized understanding.

## 2024-09-22 ENCOUNTER — Inpatient Hospital Stay

## 2024-09-23 ENCOUNTER — Encounter (HOSPITAL_COMMUNITY): Payer: Self-pay

## 2024-09-24 ENCOUNTER — Inpatient Hospital Stay: Attending: Hematology and Oncology | Admitting: Adult Health

## 2024-09-24 ENCOUNTER — Inpatient Hospital Stay

## 2024-09-24 DIAGNOSIS — D696 Thrombocytopenia, unspecified: Secondary | ICD-10-CM | POA: Diagnosis not present

## 2024-09-24 DIAGNOSIS — I482 Chronic atrial fibrillation, unspecified: Secondary | ICD-10-CM | POA: Insufficient documentation

## 2024-09-24 DIAGNOSIS — Z7901 Long term (current) use of anticoagulants: Secondary | ICD-10-CM | POA: Insufficient documentation

## 2024-09-24 DIAGNOSIS — Z7289 Other problems related to lifestyle: Secondary | ICD-10-CM | POA: Insufficient documentation

## 2024-09-24 DIAGNOSIS — C8312 Mantle cell lymphoma, intrathoracic lymph nodes: Secondary | ICD-10-CM | POA: Insufficient documentation

## 2024-09-24 DIAGNOSIS — Z8249 Family history of ischemic heart disease and other diseases of the circulatory system: Secondary | ICD-10-CM | POA: Insufficient documentation

## 2024-09-24 DIAGNOSIS — R310 Gross hematuria: Secondary | ICD-10-CM | POA: Insufficient documentation

## 2024-09-24 DIAGNOSIS — I2510 Atherosclerotic heart disease of native coronary artery without angina pectoris: Secondary | ICD-10-CM | POA: Insufficient documentation

## 2024-09-24 DIAGNOSIS — I700 Atherosclerosis of aorta: Secondary | ICD-10-CM | POA: Insufficient documentation

## 2024-09-24 DIAGNOSIS — Z79899 Other long term (current) drug therapy: Secondary | ICD-10-CM | POA: Insufficient documentation

## 2024-09-24 DIAGNOSIS — Z8042 Family history of malignant neoplasm of prostate: Secondary | ICD-10-CM | POA: Insufficient documentation

## 2024-09-24 DIAGNOSIS — R634 Abnormal weight loss: Secondary | ICD-10-CM | POA: Insufficient documentation

## 2024-09-24 DIAGNOSIS — I10 Essential (primary) hypertension: Secondary | ICD-10-CM | POA: Insufficient documentation

## 2024-09-24 DIAGNOSIS — R590 Localized enlarged lymph nodes: Secondary | ICD-10-CM | POA: Diagnosis not present

## 2024-09-24 DIAGNOSIS — R161 Splenomegaly, not elsewhere classified: Secondary | ICD-10-CM | POA: Insufficient documentation

## 2024-09-24 DIAGNOSIS — Z9089 Acquired absence of other organs: Secondary | ICD-10-CM | POA: Insufficient documentation

## 2024-09-24 LAB — CBC WITH DIFFERENTIAL (CANCER CENTER ONLY)
Abs Immature Granulocytes: 0.03 K/uL (ref 0.00–0.07)
Basophils Absolute: 0 K/uL (ref 0.0–0.1)
Basophils Relative: 1 %
Eosinophils Absolute: 0.1 K/uL (ref 0.0–0.5)
Eosinophils Relative: 3 %
HCT: 41.6 % (ref 39.0–52.0)
Hemoglobin: 13.5 g/dL (ref 13.0–17.0)
Immature Granulocytes: 1 %
Lymphocytes Relative: 24 %
Lymphs Abs: 1.1 K/uL (ref 0.7–4.0)
MCH: 29 pg (ref 26.0–34.0)
MCHC: 32.5 g/dL (ref 30.0–36.0)
MCV: 89.3 fL (ref 80.0–100.0)
Monocytes Absolute: 0.8 K/uL (ref 0.1–1.0)
Monocytes Relative: 17 %
Neutro Abs: 2.5 K/uL (ref 1.7–7.7)
Neutrophils Relative %: 54 %
Platelet Count: 128 K/uL — ABNORMAL LOW (ref 150–400)
RBC: 4.66 MIL/uL (ref 4.22–5.81)
RDW: 13.6 % (ref 11.5–15.5)
WBC Count: 4.5 K/uL (ref 4.0–10.5)
nRBC: 0 % (ref 0.0–0.2)

## 2024-09-24 MED ORDER — LIDOCAINE HCL 2 % IJ SOLN
20.0000 mL | Freq: Once | INTRAMUSCULAR | Status: AC
  Administered 2024-09-24: 400 mg
  Filled 2024-09-24: qty 20

## 2024-09-24 NOTE — Progress Notes (Signed)
 Patient tolerated his bone marrow biopsy with no overt s/s of any issues. Dressing over site is clean, dry and intact. Patient was observed for 45 minutes post procedure. Instructions for post care given. VSS-  BP 122/82 (BP Location: Left Arm, Patient Position: Sitting)   Pulse 81   Temp 98.4 F (36.9 C) (Oral)   Resp 16   SpO2 97%   Patient was ambulatory to the lobby- he has a ride picking him up.

## 2024-09-24 NOTE — Patient Instructions (Addendum)
 Bone Marrow Aspiration and Bone Marrow Biopsy, Adult, Care After The following information offers guidance on how to care for yourself after your procedure. Your health care provider may also give you more specific instructions. If you have problems or questions, contact your health care provider. What can I expect after the procedure? After the procedure, it is common to have: Mild pain and tenderness. Swelling. Bruising. Follow these instructions at home: Incision care  Follow instructions from your health care provider about how to take care of the incision site. Make sure you: Wash your hands with soap and water  for at least 20 seconds before and after you change your bandage (dressing). If soap and water  are not available, use hand sanitizer. Change your dressing as told by your health care provider. Leave stitches (sutures), skin glue, or adhesive strips in place. These skin closures may need to stay in place for 2 weeks or longer. If adhesive strip edges start to loosen and curl up, you may trim the loose edges. Do not remove adhesive strips completely unless your health care provider tells you to do that. Check your incision site every day for signs of infection. Check for: More redness, swelling, or pain. Fluid or blood. Warmth. Pus or a bad smell. Activity Return to your normal activities as told by your health care provider. Ask your health care provider what activities are safe for you. Do not lift anything that is heavier than 10 lb (4.5 kg), or the limit that you are told, until your health care provider says that it is safe. If you were given a sedative during the procedure, it can affect you for several hours. Do not drive or operate machinery until your health care provider says that it is safe. General instructions  Take over-the-counter and prescription medicines only as told by your health care provider. Do not take baths, swim, or use a hot tub until your health care  provider approves. Ask your health care provider if you may take showers. You may only be allowed to take sponge baths. If directed, put ice on the affected area. To do this: Put ice in a plastic bag. Place a towel between your skin and the bag. Leave the ice on for 20 minutes, 2-3 times a day. If your skin turns bright red, remove the ice right away to prevent skin damage. The risk of skin damage is higher if you cannot feel pain, heat, or cold. Contact a health care provider if: You have signs of infection. Your pain is not controlled with medicine. You have cancer, and a temperature of 100.81F (38C) or higher. Get help right away if: You have a temperature of 101F (38.3C) or higher, or as told by your health care provider. You have bleeding from the incision site that cannot be controlled. This information is not intended to replace advice given to you by your health care provider. Make sure you discuss any questions you have with your health care provider. Document Revised: 03/12/2022 Document Reviewed: 03/12/2022 Elsevier Patient Education  2024 [employer]Bone Marrow Aspiration and Bone Marrow Biopsy, Adult Bone marrow aspiration and bone marrow biopsy are procedures that are done to diagnose blood disorders. These procedures may also be done to help diagnose cancer or certain infections. Bone marrow is the soft tissue that is inside the bones. Blood cells are produced in bone marrow. For a bone marrow aspiration, a sample of tissue in liquid form is removed from inside the bone. For a bone marrow  biopsy, a small sample of solid bone marrow tissue is removed. You may need these procedures if you get an abnormal result on a blood test called a complete blood count (CBC). The aspiration or biopsy sample is usually taken from the upper part of the hip bone. Sometimes, an aspiration sample is taken from the chest bone (sternum). These samples are examined under a microscope or tested in a  lab. Tell a health care provider about: Any allergies you have. All medicines you are taking, including vitamins, herbs, eye drops, creams, and over-the-counter medicines. Any problems you or family members have had with anesthetic medicines. Any bleeding problems or bone disorders you have. Any surgeries you have had. Any medical conditions you have. Any recent infections you have had, including skin infections. Whether you are pregnant or may be pregnant. What are the risks? Your health care provider will talk with you about risks. These may include: Bleeding. Infection. Persistent pain after the procedure. Cracking (fracture) of the bone. Allergic reactions to medicines. Damage to nearby organs, if the sample is taken from the sternum. What happens before the procedure? When to stop eating and drinking Follow instructions from your health care provider about what you may eat and drink. These may include: 8 hours before your procedure Stop eating most foods. Do not eat meat, fried foods, or fatty foods. Eat only light foods, such as toast or crackers. All liquids are okay except energy drinks and alcohol. 6 hours before your procedure Stop eating. Drink only clear liquids, such as water , clear fruit juice, black coffee, plain tea, and sports drinks. Do not drink energy drinks or alcohol. 2 hours before your procedure Stop drinking all liquids. You may be allowed to take medicines with small sips of water .  Medicines Ask your health care provider about: Changing or stopping your regular medicines. These include any diabetes medicines or blood thinners you take. Taking medicines such as aspirin and ibuprofen. These medicines can thin your blood. Do not take them unless your health care provider tells you to. Taking over-the-counter medicines, vitamins, herbs, and supplements. General instructions If you will be going home right after the procedure, plan to have a responsible  adult: Take you home from the hospital or clinic. You will not be allowed to drive. Care for you for the time you are told. Ask your health care provider: How your surgery site will be marked. What steps will be taken to help prevent infection. These may include: Removing hair at the surgery site. Washing skin with a soap that kills germs. What happens during the procedure?  An IV may be inserted into one of your veins. You may be given: A sedative. This helps you relax. Anesthesia. This will: Numb certain areas of your body. Make you fall asleep for surgery. You will be positioned on your stomach or back, based on the site of the procedure. The skin over the site will be cleaned with an antiseptic solution. The bone marrow sample will be removed as follows: A small incision will be made into your skin. For an aspiration, a hollow needle will be inserted through your skin and into your bone. Bone marrow fluid will be drawn up into the needle. You may feel a sharp, stinging sensation when the fluid is removed. For a biopsy, a hollow needle will be used to remove a small sample of solid tissue from your bone marrow. The needle will be removed and pressure will be applied to the site. The  incision will be closed with stitches (sutures), skin glue, or adhesive strips. A bandage (dressing) will be placed over the incision site and taped in place. Bone marrow fluid or tissue will be sent to a lab for examination. The procedure may vary among health care providers and hospitals. What happens after the procedure? Your blood pressure, heart rate, breathing rate, and blood oxygen level will be monitored until you leave the hospital or clinic. Your IV will be removed, and the incision site will be checked for bleeding. It is up to you to get the results of your procedure. Ask your health care provider, or the department that is doing the procedure, when your results will be ready. This  information is not intended to replace advice given to you by your health care provider. Make sure you discuss any questions you have with your health care provider. Document Revised: 03/12/2022 Document Reviewed: 03/12/2022 Elsevier Patient Education  2024 [employer].

## 2024-09-24 NOTE — Progress Notes (Signed)
 INDICATION: lymphoma, staging  Brief examination was performed. ENT: adequate airway clearance Heart: regular rate and rhythm.No Murmurs Lungs: clear to auscultation, no wheezes, normal respiratory effort  Bone Marrow Biopsy and Aspiration Procedure Note   Informed consent was obtained and potential risks including bleeding, infection and pain were reviewed with the patient.  The patient's name, date of birth, identification, consent and allergies were verified prior to the start of procedure and time out was performed.  The left posterior iliac crest was chosen as the site of biopsy.  The skin was prepped with ChloraPrep.   10 cc of 2% lidocaine  was used to provide local anaesthesia.   10 cc of bone marrow aspirate was obtained followed by 1cm biopsy.  Pressure was applied to the biopsy site and bandage was placed over the biopsy site. Patient was made to lie on the back for 30 mins prior to discharge.  The procedure was tolerated well. COMPLICATIONS: None BLOOD LOSS: none The patient was discharged home in stable condition with a 1 week follow up to review results.  Patient was provided with post bone marrow biopsy instructions and instructed to call if there was any bleeding or worsening pain.  Specimens sent for flow cytometry, cytogenetics and additional studies.  Signed Morna JAYSON Kendall, NP

## 2024-09-25 ENCOUNTER — Encounter (HOSPITAL_COMMUNITY)
Admission: RE | Admit: 2024-09-25 | Discharge: 2024-09-25 | Disposition: A | Discharge status: Home / Self Care | Source: Ambulatory Visit | Attending: Hematology and Oncology | Admitting: Hematology and Oncology

## 2024-09-25 DIAGNOSIS — C8312 Mantle cell lymphoma, intrathoracic lymph nodes: Secondary | ICD-10-CM | POA: Diagnosis not present

## 2024-09-25 DIAGNOSIS — C8332 Diffuse large B-cell lymphoma, intrathoracic lymph nodes: Secondary | ICD-10-CM | POA: Diagnosis not present

## 2024-09-25 DIAGNOSIS — C8333 Diffuse large B-cell lymphoma, intra-abdominal lymph nodes: Secondary | ICD-10-CM | POA: Diagnosis not present

## 2024-09-25 DIAGNOSIS — R591 Generalized enlarged lymph nodes: Secondary | ICD-10-CM | POA: Diagnosis not present

## 2024-09-25 DIAGNOSIS — C8331 Diffuse large B-cell lymphoma, lymph nodes of head, face, and neck: Secondary | ICD-10-CM | POA: Diagnosis not present

## 2024-09-25 LAB — GLUCOSE, CAPILLARY: Glucose-Capillary: 94 mg/dL (ref 70–99)

## 2024-09-25 MED ORDER — FLUDEOXYGLUCOSE F - 18 (FDG) INJECTION
8.0000 | Freq: Once | INTRAVENOUS | Status: AC
  Administered 2024-09-25: 7.94 via INTRAVENOUS

## 2024-09-29 ENCOUNTER — Inpatient Hospital Stay: Admitting: Hematology

## 2024-09-29 VITALS — BP 135/87 | HR 91 | Temp 97.9°F | Resp 20 | Wt 160.2 lb

## 2024-09-29 DIAGNOSIS — E538 Deficiency of other specified B group vitamins: Secondary | ICD-10-CM | POA: Diagnosis not present

## 2024-09-29 DIAGNOSIS — C8312 Mantle cell lymphoma, intrathoracic lymph nodes: Secondary | ICD-10-CM

## 2024-09-29 LAB — SURGICAL PATHOLOGY

## 2024-09-29 NOTE — Progress Notes (Incomplete)
 HEMATOLOGY/ONCOLOGY CONSULTATION NOTE  Date of Service: 09/29/2024  Patient Care Team: Kip Righter, MD as PCP - General (Family Medicine) Shlomo Wilbert SAUNDERS, MD as PCP - Cardiology (Cardiology) Odean Potts, MD as Consulting Physician (Hematology and Oncology) Elana Montie CROME, RN as Oncology Nurse Navigator  CHIEF COMPLAINTS/PURPOSE OF CONSULTATION:  ***Scott Newton is here for a consultation and review of his Mantle cell CD5 non-hodgkin's lymphoma.   HISTORY OF PRESENTING ILLNESS:  Scott Newton is a wonderful 78 y.o. male who has been referred to us  by Dr. Potts Odean, MD for evaluation and management of his mantle cell non-hodgkin's lymphoma ***. {ELcompanions/ambulations (Optional):33762}  He initially noticed some significant bruising and significant weight loss. He went from 176-159 lbs. He reports that his appetite has been suppressed over the course of the past few months. He has noticed he is affected by the cold more in the past few months. He began taking 1000mcg of B12 vitamin in response to his decreased B12 levels in his last labs. He denies unexplained fevers, night sweats, sudden changes to fatigue, difficulty swallowing, or a change in bowel habits. He experienced nausea for a short time in the past few months, but this has improved.  [name] has been on Eliquis  for about 10 years for his A Fib. He also has been diagnosed with BPH. He is taking flomax regularly for this.  He had a rt total hip arthroplasty in March. He had a PET scan, in which some lumps were discovered in the abdominal region.   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 He denies any exposure to hazardous chemicals. He used to travel often for work. He was a former smoker but quit 20 years ago. He has 2-3 glasses of wine a few times a week.  He had two brothers die of cancer, one from prostate cancer.   MEDICAL HISTORY:  Past Medical History:  Diagnosis Date    Anticoagulant long-term use    eliquis    BPH with obstruction/lower urinary tract symptoms    Chronic atrial fibrillation [facility]) cardiologist-  dr wilbert turner   dx 10/ 2016---  s/p DCCV with reversion back to afib now pursuing rate control. 10-19-2015   Dysrhythmia 2015   Afib   ED (erectile dysfunction)    Essential hypertension    Full dentures    Gross hematuria    Peyronie's disease    Umbilical hernia    Weak urinary stream    Wears glasses     SURGICAL HISTORY: Past Surgical History:  Procedure Laterality Date   CARDIOVERSION N/A 10/19/2015   Procedure: CARDIOVERSION;  Surgeon: Maude JAYSON Emmer, MD;  Location: [facility] ENDOSCOPY;  Service: Cardiovascular;  Laterality: N/A;   CYSTOSCOPY WITH FULGERATION N/A 03/18/2018   Procedure: CYSTOSCOPY WITH FULGERATION/ BLADDER BIOPSY;  Surgeon: Nieves Cough, MD;  [facility];  Service: Urology;  Laterality: N/A;   CYSTOSCOPY WITH FULGERATION N/A 09/15/2019   Procedure: CYSTOSCOPY WITH FULGERATION/BLADDER BIOPSY/ BILATERAL RETROGRADE;  Surgeon: Nieves Cough, MD;  Location: [facility] ORS;  Service: Urology;  Laterality: N/A;   CYSTOSCOPY WITH INSERTION OF UROLIFT N/A 03/18/2018   Procedure: CYSTOSCOPY WITH INSERTION OF UROLIFT;  Surgeon: Nieves Cough, MD;  Location: [facility];  Service: Urology;  Laterality: N/A;   IR US  LIVER BIOPSY  09/09/2024   KNEE ARTHROSCOPY Left 2009  approx.   TONSILLECTOMY  child   TRANSTHORACIC ECHOCARDIOGRAM  09-27-2015  dr wilbert turner  mild focal basal hypertrophy of the septum,  ef 60-65%/  trivial AR/  mild MR/  severe LAE/      SOCIAL HISTORY: Social History   Socioeconomic History   Marital status: Married    Spouse name: Not on file   Number of children: 3   Years of education: college   Highest education level: Not on file  Occupational History   Occupation: unemployed  Tobacco Use   Smoking status: Some Days    Types: Cigars, Cigarettes   Smokeless  tobacco: Never   Tobacco comments:    03-13-2018 per pt quit cigarettes 1990s, but currently smokes occasional cigar  Vaping Use   Vaping status: Never Used  Substance and Sexual Activity   Alcohol use: Yes    Alcohol/week: 14.0 standard drinks of alcohol    Types: 14 Glasses of wine per week    Comment: average 2 wine daily   Drug use: No   Sexual activity: Not on file  Other Topics Concern   Not on file  Social History Narrative   Not on file   Social Drivers of Health   Financial Resource Strain: Not on file  Food Insecurity: Patient Declined (09/26/2024)   Hunger Vital Sign    Worried About Running Out of Food in the Last Year: Patient declined    Ran Out of Food in the Last Year: Patient declined  Transportation Needs: Patient Declined (09/26/2024)   PRAPARE - Administrator, Civil Service (Medical): Patient declined    Lack of Transportation (Non-Medical): Patient declined  Physical Activity: Not on file  Stress: Not on file  Social Connections: Not on file  Intimate Partner Violence: Not At Risk (07/01/2024)   Humiliation, Afraid, Rape, and Kick questionnaire    Fear of Current or Ex-Partner: No    Emotionally Abused: No    Physically Abused: No    Sexually Abused: No   Social History   Social History Narrative   Not on file    FAMILY HISTORY: Family History  Problem Relation Age of Onset   Heart disease Father 32   Heart attack Father 76   Hypertension Mother 55   Heart attack Brother 60   Stroke Neg Hx     ALLERGIES:  has no known allergies.  MEDICATIONS:  Current Outpatient Medications  Medication Sig Dispense Refill   acetaminophen  (TYLENOL ) 500 MG tablet Take 500 mg by mouth every 6 (six) hours as needed for moderate pain or headache.     apixaban  (ELIQUIS ) 5 MG TABS tablet Take 1 tablet (5 mg total) by mouth 2 (two) times daily. 180 tablet 3   finasteride (PROSCAR) 5 MG tablet Take 5 mg by mouth daily.     Magnesium 250 MG TABS Take  250 mg by mouth daily at 6 (six) AM.     metoprolol  succinate (TOPROL -XL) 50 MG 24 hr tablet TAKE 1.5 TABLETS EVERY MORNING AND 0.5 TAB EVERY EVENING TAKE WITH OR IMMEDIATELY FOLLOWING A MEAL. 180 tablet 3   Multiple Vitamins-Minerals (MULTIVITAMIN WITH MINERALS) tablet Take 1 tablet by mouth 2 (two) times a week.      sildenafil (REVATIO) 20 MG tablet Take 20-100 mg by mouth daily as needed for erectile dysfunction.     tamsulosin (FLOMAX) 0.4 MG CAPS capsule Take 0.4 mg by mouth daily.     No current facility-administered medications for this visit.    REVIEW OF SYSTEMS:    10 Point review of Systems was done is  negative except as noted above.  PHYSICAL EXAMINATION: ECOG PERFORMANCE STATUS: {CHL ONC ECOG ED:[id number]}  Vitals:   09/29/24 1414  BP: 135/87  Pulse: 91  Resp: 20  Temp: 97.9 F (36.6 C)  SpO2: 99%   Filed Weights   09/29/24 1414  Weight: 160 lb 3.2 oz (72.7 kg)   Body mass index is 23.66 kg/m.  GENERAL: alert, in no acute distress and comfortable SKIN: no acute rashes, no significant lesions EYES: conjunctiva are pink and non-injected, sclera anicteric OROPHARYNX: MMM, no exudates, no oropharyngeal erythema or ulceration NECK: supple, no JVD LYMPH: no palpable lymphadenopathy in the cervical, axillary or inguinal regions LUNGS: clear to auscultation b/l with normal respiratory effort HEART: regular rate & rhythm ABDOMEN: normoactive bowel sounds, non tender, not distended, no hepatosplenomegaly Extremity: no pedal edema PSYCH: alert & oriented x 3 with fluent speech NEURO: no focal motor/sensory deficits  LABORATORY DATA:  I have reviewed the data as listed     Latest Ref Rng & Units 09/24/2024    9:26 AM 09/16/2024    3:13 PM 07/01/2024    2:25 PM  CBC  WBC 4.0 - 10.5 K/uL 4.5  4.6  5.0   Hemoglobin 13.0 - 17.0 g/dL 86.4  85.5  85.2   Hematocrit 39.0 - 52.0 % 41.6  43.5  44.4   Platelets 150 - 400 K/uL 128  131  150     {ELAddOncLabs  (Optional):33736}     Latest Ref Rng & Units 09/16/2024    3:13 PM 09/14/2021    8:57 AM 09/06/2020    9:43 AM  CMP  Glucose 70 - 99 mg/dL 91  78  96   BUN 8 - 23 mg/dL 13  14  14    Creatinine 0.61 - 1.24 mg/dL 9.11  9.06  8.98   Sodium 135 - 145 mmol/L 138  141  139   Potassium 3.5 - 5.1 mmol/L 4.5  4.6  4.2   Chloride 98 - 111 mmol/L 103  103  101   CO2 22 - 32 mmol/L 30  26  25    Calcium 8.9 - 10.3 mg/dL 9.6  9.0  9.1   Total Protein 6.5 - 8.1 g/dL 7.8     Total Bilirubin 0.0 - 1.2 mg/dL 0.7     Alkaline Phos 38 - 126 U/L 133     AST 15 - 41 U/L 29     ALT 0 - 44 U/L 19      Narrative & Impression  Surgical Pathology CASE: [id number] PATIENT: Zacchaeus Ono Bone Marrow Report     Clinical History: lymphadenopathy, lymphoma work up and staging     DIAGNOSIS:  BONE MARROW, ASPIRATE, CLOT, CORE: - Hypercellular bone marrow (60%) involved by the patient's known CD5 positive B- cell lymphoma (approximately 10% cellularity).  See comment.  PERIPHERAL BLOOD: - Thrombocytopenia  COMMENT: The patient's history of a CD5 positive mature B-cell lymphoma with low-grade B-cell lymphoma FISH panel performed on the lymph node at [facility] positive for t(11,14), consistent with mantle cell lymphoma, is noted.  Findings on the bone marrow specimen are similar and reveal involvement by the patient's lymphoma to approximately 10% of the cellularity.  Additionally, flow cytometric analysis revealed a similar abnormal clonal B-cell population comprising 13% of lymphocytes.  The cells are positive for CD5, CD19, CD20, CD200, HLA-DR and express lambda light chains.  Correlation with pending cytogenetics is recommended for further assessment.  MICROSCOPIC DESCRIPTION:  PERIPHERAL BLOOD SMEAR: Platelets: Decreased, no platelet  clumps identified Erythroid: Adequate Leukocytes: Adequate, negative for dysplastic granulocytes or blasts  BONE MARROW ASPIRATE:  Cellular Erythroid precursors: Erythroid precursors show a full sequence of maturation Granulocytic precursors: Granulocytic precursors show a full sequence of maturation Megakaryocytes: Typical in number and morphology with occasional hypolobated forms Lymphocytes/plasma cells: Increased lymphocytes  TOUCH PREPARATIONS: Cellular, confirmatory of aspirate findings  CLOT AND BIOPSY: The bone marrow clot and biopsy reveal hypercellular bone marrow with trilineage hematopoiesis.  Lymphoid aggregates comprising approximately 10% of cellularity are noted in the clot and biopsy section.  The findings are confirmatory of the aspirate and touch prep impression  SPECIAL STAINS: CD3: CD3 highlights T lymphocytes in the lymphoid aggregate CD20: CD20 highlights B lymphocytes in the lymphoid aggregate (more than CD3) PAX5: PAX5 highlights B lymphocytes in the lymphoid aggregate Cyclin D1: Cyclin D1 is positive in the lymphoid aggregate  IRON STAIN: Iron stains are performed on a bone marrow aspirate or touch imprint smear and section of clot. The controls stained appropriately.       Storage Iron: Scant      Ring Sideroblasts: Not identified  ADDITIONAL DATA/TESTING: Cytogenetics   CELL COUNT DATA:  Bone Marrow count performed on 500 cells shows: Blasts:   0%   Myeloid:  41% Promyelocytes: 1%   Erythroid:     45% Myelocytes:    5%   Lymphocytes:   13% Metamyelocytes:     6%   Plasma cells:  1% Bands:    4% Neutrophils:   20%  M:E ratio:     0.91 Eosinophils:   5% Basophils:     0% Monocytes:     0%  Lab Data: CBC performed on 09/24/2024 shows: WBC: 4.54 k/uL Neutrophils:   54% Hgb: 13.5 g/dL Lymphocytes:   67% HCT: 41.6 %    Monocytes:     10% MCV: 89.3 fL   Eosinophils:   2% RDW: 13.6 %    Basophils:     2% PLT: 128 k/uL    GROSS DESCRIPTION:  A: Aspirate smear  B: The specimen is received in B plus fixative, and consists of a 10.0 x 5.0 x 2.0 mm aggregate of red-brown  clotted blood.  The specimen is entirely submitted in 1 cassette.  C: The specimen is received in B plus fixative, and consists of 2 cores of red-brown bone, measuring 0.3 and 0.8 cm in length by 0.2 cm in diameter.  The specimen is entirely submitted in 1 cassette following decalcification with Immunocal.  SHIRLEEN 09/24/2024)   Final Diagnosis performed by Ilsa Pottier, MD.   Electronically signed 09/29/2024 Technical and / or Professional components performed at [facility], [address], [place], [place] [address].  Immunohistochemistry Technical component (if applicable) was performed at [facility]. [address], [place], [place] [address].   IMMUNOHISTOCHEMISTRY DISCLAIMER (if applicable): Some of these immunohistochemical stains may have been developed and the performance characteristics determine by [facility]. Some may not have been cleared or approved by the U.S. Food and Drug Administration. The FDA has determined that such clearance or approval is not necessary. This test is used for clinical purposes. It should not be regarded as investigational or for research. This laboratory is certified under the Clinical Laboratory Improvement Amendments of 1988 (CLIA-88) as qualified to perform high complexity clinical laboratory testing.  The controls stained appropriately.   IHC stains are performed on formalin fixed, paraffin embedded tissue using a 3,3diaminobenzidine (DAB) chromogen and Leica Bond  Autostainer System. The staining intensity of the nucleus is score manually and is reported as the percentage of tumor cell nuclei demonstrating specific nuclear staining. The specimens are fixed in 10% Neutral Formalin for at least 6 hours and up to 72hrs. These tests are validated on decalcified tissue. Results should be interpreted with caution given the possibility of false negative results on decalcified specimens.  Antibody Clones are as follows ER-clone 48F, PR-clone 16, Ki67- clone MM1. Some of these immunohistochemical stains may have been developed and the performance characteristics determined by [facility].    RADIOGRAPHIC STUDIES: I have personally reviewed the radiological images as listed and agreed with the findings in the report. NM PET Image Initial (PI) Skull Base To Thigh (F-18 FDG) Result Date: 09/29/2024 EXAM: PET AND CT SKULL BASE TO MID THIGH 09/25/2024 08:49:01 AM TECHNIQUE: RADIOPHARMACEUTICAL: 7.94 mCi F-18 FDG Uptake time 60 minutes. Glucose level 94 mg/dl. PET imaging was acquired from the base of the skull to the mid thighs. Non-contrast enhanced computed tomography was obtained for attenuation correction and anatomic localization. COMPARISON: Neck and chest CT of 08/26/2024. abdominopelvic CT of 08/05/2024 also reviewed ([facility]). CLINICAL HISTORY: Hematologic malignancy, staging. FINDINGS: Blood pool activity SUV 2.4. Liver activity SUV 3.0. HEAD AND NECK: Bilateral hypermetabolic cervical nodes. Index low left jugular node measures 1.4 cm and SUV 6.4 on image 43/4. CHEST: Extensive bilateral axillary, mediastinal, and hilar hypermetabolic adenopathy. Index right paratracheal node measures 1.9 cm and SUV 8.7 on image 58/4. Mild cardiomegaly. Aortic and coronary artery atherosclerosis. No hypermetabolic pulmonary nodules. Scattered subpleural pulmonary nodules including within the right middle lobe measuring up to 7 mm are likely subpleural lymph nodes. ABDOMEN AND PELVIS: Mild splenic hypermetabolism relative to the liver at SUV 4.3. splenomegaly at 14.6 cm on prior diagnostic CT. Abdominal pelvic hypermetabolic adenopathy. Index left periaortic node measures 1.8 cm and SUV 8.4 on image 132/4. A left external iliac / inguinal node measures 1.1 cm and SUV 5.8 on image 180/4. Normal adrenal glands. Bilateral renal cysts. Abdominal aortic atherosclerosis. Fat containing  ventral abdominal wall hernia. Mild prostatomegaly with urolift implants and bilateral bladder diverticula. Physiologic activity within the gastrointestinal and genitourinary systems. BONES AND SOFT TISSUE: Right hip arthroplasty. No abnormal FDG activity localizes to the bones. No metabolically active aggressive osseous lesion. IMPRESSION: 1. Evidence of active nodal lymphoma within the neck, chest, abdomen, and pelvis as detailed above. 2. Mild splenic hypermetabolism and splenomegaly, suspicious for splenic involvement with lymphoma. 3. Incidental findings, including: Aortic atherosclerosis (icd10-i70.0). Coronary artery atherosclerosis. Prostatomegaly with bladder outlet obstruction. Electronically signed by: Rockey Kilts MD 09/29/2024 09:05 AM EST RP Workstation: [id number]   IR LYMPH NODE CORE BIOPSY Result Date: 09/09/2024 INDICATION: 78 year old with lymphadenopathy. EXAM: ULTRASOUND-GUIDED CORE BIOPSY OF LEFT AXILLARY LYMPH NODE MEDICATIONS: 1% lidocaine  for local anesthetic ANESTHESIA/SEDATION: None FLUOROSCOPY TIME:  None COMPLICATIONS: None immediate. PROCEDURE: Informed written consent was obtained from the patient after a thorough discussion of the procedural risks, benefits and alternatives. All questions were addressed. Maximal Sterile Barrier Technique was utilized including caps, mask, sterile gowns, sterile gloves, sterile drape, hand hygiene and skin antiseptic. A timeout was performed prior to the initiation of the procedure. Left axillary region was evaluated with ultrasound. Enlarged lymph nodes identified. Prominent superficial lymph node was targeted. Left axillary region was prepped with chlorhexidine  and sterile field was created. Skin was anesthetized using 1% lidocaine . Small incision was made. Using ultrasound guidance, 17 gauge coaxial needle was directed into the lymph node. Multiple core biopsies  were obtained with an 18 gauge core device. Specimens placed on a Telfa pad with  saline. 17 gauge needle was removed. Bandage placed over the puncture site. FINDINGS: Enlarged left axillary and left cervical lymph nodes. Superficial enlarged left axillary lymph node was biopsied. Biopsy needle confirmed within the lymph node. No immediate bleeding or hematoma formation. IMPRESSION: Ultrasound-guided core biopsy of an enlarged left axillary lymph node. Electronically Signed   By: Juliene Balder M.D.   On: 09/09/2024 12:47    ASSESSMENT & PLAN:  78 y.o. male diagnosed with mantle cell non-hodgkin's lymphoma.   ***   PLAN:  -Discussed results from latest bone marrow biopsy and PET scan -Diagnosis of Mantle cell lymphoma, possibly low-grade, stage 4 -Discussed possible treatment methods in order to create a deep immuno response  -Treatment for stage 4 -Will plan to obtain molecular studies and genetic testing  -Will begin a single agent chemotherapy drug, split dose every two days over four weeks, up to six cycles -Will begin Rituxan in addition to chemotherapy treatment  -If it looks like WBC counts are taking longer to bounce back, a steroid shot may be utilized -A repeat PET scan will be needed halfway through treatment  -Discussed setting Mr. Sensabaugh up with a separate education session with nurse  -BTK inibitors may be used as a second line of defense against lymphoma -Counseled to limit alcohol consumption while in treatment  -Put orders in for treatment in begin in the next few weeks, contingent on Mr. Borowiak Thanksgiving plans    FOLLOW-UP in {WEEKS/MONTHS:30939} with {Provider Follow-up:31276}  The total time spent in the appointment was *** minutes* . All of the patient's questions were answered and the patient knows to call the clinic with any problems, questions, or concerns.  Emaline Saran MD MS AAHIVMS [facility] Hematology/Oncology Physician [facility]  *Total Encounter Time as defined by the Centers for Medicare and Medicaid Services includes, in  addition to the face-to-face time of a patient visit (documented in the note above) non-face-to-face time: obtaining and reviewing outside history, ordering and reviewing medications, tests or procedures, care coordination (communications with other health care professionals or caregivers) and documentation in the medical record.  I, Alan Blowers, acting as a neurosurgeon for Emaline Saran, MD.,have documented all relevant documentation on the behalf of Emaline Saran, MD,as directed by  Emaline Saran, MD while in the presence of Emaline Saran, MD.  I have reviewed the above documentation for accuracy and completeness, and I agree with the above.  Gautam Kale, MD

## 2024-09-29 NOTE — Progress Notes (Signed)
 Mr Scott Newton is a previous patient of Dr Gara who, d/t new diagnosis has been referred to Dr Onesimo for care. I met patient and explained my role in his care and gave him my card with contact information and reassured him that he can call with any questions or needs.

## 2024-09-30 ENCOUNTER — Inpatient Hospital Stay: Admitting: Hematology and Oncology

## 2024-10-01 NOTE — Progress Notes (Signed)
 TP53 testing requested on Surgical Pathology sample.

## 2024-10-02 ENCOUNTER — Encounter (HOSPITAL_COMMUNITY): Payer: Self-pay

## 2024-10-02 NOTE — Progress Notes (Signed)
 HEMATOLOGY/ONCOLOGY CONSULTATION NOTE  Date of Service: 09/29/2024  Patient Care Team: Kip Righter, MD as PCP - General (Family Medicine) Shlomo Wilbert SAUNDERS, MD as PCP - Cardiology (Cardiology) Odean Potts, MD as Consulting Physician (Hematology and Oncology) Elana Montie CROME, RN as Oncology Nurse Navigator  CHIEF COMPLAINTS/PURPOSE OF CONSULTATION:  Scott Newton is here for a Evaluation and management of newly diagnosed his Mantle cell non-hodgkin's lymphoma.   HISTORY OF PRESENTING ILLNESS:   Scott Newton is a wonderful 78 y.o. male who has been referred to us  by Dr. Potts Odean, MD for evaluation and management of his newly mantle cell non-hodgkin's lymphoma.   He initially noticed some significant bruising and significant weight loss. He went from 176 to 159 lbs. He reports that his appetite has been suppressed over the course of the past few months. He has noticed he is affected by the cold more in the past few months. He began taking 1000mcg of B12 vitamin in response to his decreased B12 levels in his last labs. He denies unexplained fevers, night sweats, sudden changes to fatigue, difficulty swallowing, or a change in bowel habits. He experienced nausea for a short time in the past few months, but this has improved.  He was initially seen by Dr. Gudena in August 2025 for thrombocytopenia and evaluated for this. Patient subsequently had gross hematuria and was evaluated by urology and had a CT of the abdomen done on 08/05/2024 with his urologist that showed splenomegaly and significant lymphadenopathy in the abdomen.  Patient subsequently had a CT of the chest and neck with Dr. Odean in October 2025 which showed a lymph node at the left base of the neck, multiple enlarged lymph nodes mediastinal, bilateral hilar and axillary.  Multiple enlarged upper abdominal lymph nodes and an enlarged spleen.  Pleural-based 4 x 6 mm opacity in the right middle lobe of the lung.  Patient  subsequently had a lymph node biopsy on 09/09/2024 office left axillary lymph node which showed a CD5 positive mature B-cell lymphoma consistent with mantle cell lymphoma. Bone marrow biopsy done on 09/24/2024 showed hypercellular marrow with 10% involvement with CD5 positive B-cell lymphoma with [facility] results showing translocation T (11;4 ) positive consistent with mantle cell lymphoma.   Scott Newton has been on Eliquis  for about 10 years for his A Fib. He also has been diagnosed with BPH. He is taking flomax regularly for this.  He had a rt total hip arthroplasty in March. He had a PET scan, in which some lumps were discovered in the abdominal region.   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 He denies any exposure to hazardous chemicals. He used to travel often for work. He was a former smoker but quit 20 years ago. He has 2-3 glasses of wine a few times a week.  He had two brothers die of cancer, one from prostate cancer.     MEDICAL HISTORY:  Past Medical History:  Diagnosis Date   Anticoagulant long-term use    eliquis    BPH with obstruction/lower urinary tract symptoms    Chronic atrial fibrillation [facility]) cardiologist-  dr wilbert turner   dx 10/ 2016---  s/p DCCV with reversion back to afib now pursuing rate control. 10-19-2015   Dysrhythmia 2015   Afib   ED (erectile dysfunction)    Essential hypertension    Full dentures    Gross hematuria    Peyronie's disease    Umbilical  hernia    Weak urinary stream    Wears glasses     SURGICAL HISTORY: Past Surgical History:  Procedure Laterality Date   CARDIOVERSION N/A 10/19/2015   Procedure: CARDIOVERSION;  Surgeon: Maude JAYSON Emmer, MD;  Location: [facility] ENDOSCOPY;  Service: Cardiovascular;  Laterality: N/A;   CYSTOSCOPY WITH FULGERATION N/A 03/18/2018   Procedure: CYSTOSCOPY WITH FULGERATION/ BLADDER BIOPSY;  Surgeon: Nieves Cough, MD;  Location: [facility];  Service: Urology;   Laterality: N/A;   CYSTOSCOPY WITH FULGERATION N/A 09/15/2019   Procedure: CYSTOSCOPY WITH FULGERATION/BLADDER BIOPSY/ BILATERAL RETROGRADE;  Surgeon: Nieves Cough, MD;  Location: [facility]S;  Service: Urology;  Laterality: N/A;   CYSTOSCOPY WITH INSERTION OF UROLIFT N/A 03/18/2018   Procedure: CYSTOSCOPY WITH INSERTION OF UROLIFT;  Surgeon: Nieves Cough, MD;  Location: [facility];  Service: Urology;  Laterality: N/A;   IR US  LIVER BIOPSY  09/09/2024   KNEE ARTHROSCOPY Left 2009  approx.   TONSILLECTOMY  child   TRANSTHORACIC ECHOCARDIOGRAM  09-27-2015  dr wilbert turner   mild focal basal hypertrophy of the septum,  ef 60-65%/  trivial AR/  mild MR/  severe LAE/      SOCIAL HISTORY: Social History   Socioeconomic History   Marital status: Married    Spouse name: Not on file   Number of children: 3   Years of education: college   Highest education level: Not on file  Occupational History   Occupation: unemployed  Tobacco Use   Smoking status: Some Days    Types: Cigars, Cigarettes   Smokeless tobacco: Never   Tobacco comments:    03-13-2018 per pt quit cigarettes 1990s, but currently smokes occasional cigar  Vaping Use   Vaping status: Never Used  Substance and Sexual Activity   Alcohol use: Yes    Alcohol/week: 14.0 standard drinks of alcohol    Types: 14 Glasses of wine per week    Comment: average 2 wine daily   Drug use: No   Sexual activity: Not on file  Other Topics Concern   Not on file  Social History Narrative   Not on file   Social Drivers of Health   Financial Resource Strain: Not on file  Food Insecurity: Patient Declined (09/26/2024)   Hunger Vital Sign    Worried About Running Out of Food in the Last Year: Patient declined    Ran Out of Food in the Last Year: Patient declined  Transportation Needs: Patient Declined (09/26/2024)   PRAPARE - Administrator, Civil Service (Medical): Patient declined    Lack of Transportation  (Non-Medical): Patient declined  Physical Activity: Not on file  Stress: Not on file  Social Connections: Not on file  Intimate Partner Violence: Not At Risk (07/01/2024)   Humiliation, Afraid, Rape, and Kick questionnaire    Fear of Current or Ex-Partner: No    Emotionally Abused: No    Physically Abused: No    Sexually Abused: No   Social History   Social History Narrative   Not on file    FAMILY HISTORY: Family History  Problem Relation Age of Onset   Heart disease Father 13   Heart attack Father 24   Hypertension Mother 10   Heart attack Brother 48   Stroke Neg Hx     ALLERGIES:  has no known allergies.  MEDICATIONS:  Current Outpatient Medications  Medication Sig Dispense Refill   acetaminophen  (TYLENOL ) 500 MG tablet Take 500 mg by mouth every 6 (  six) hours as needed for moderate pain or headache.     apixaban  (ELIQUIS ) 5 MG TABS tablet Take 1 tablet (5 mg total) by mouth 2 (two) times daily. 180 tablet 3   finasteride (PROSCAR) 5 MG tablet Take 5 mg by mouth daily.     Magnesium 250 MG TABS Take 250 mg by mouth daily at 6 (six) AM.     metoprolol  succinate (TOPROL -XL) 50 MG 24 hr tablet TAKE 1.5 TABLETS EVERY MORNING AND 0.5 TAB EVERY EVENING TAKE WITH OR IMMEDIATELY FOLLOWING A MEAL. 180 tablet 3   Multiple Vitamins-Minerals (MULTIVITAMIN WITH MINERALS) tablet Take 1 tablet by mouth 2 (two) times a week.      sildenafil (REVATIO) 20 MG tablet Take 20-100 mg by mouth daily as needed for erectile dysfunction.     tamsulosin (FLOMAX) 0.4 MG CAPS capsule Take 0.4 mg by mouth daily.     No current facility-administered medications for this visit.    REVIEW OF SYSTEMS:    10 Point review of Systems was done is negative except as noted above.  PHYSICAL EXAMINATION: ECOG PERFORMANCE STATUS: 2 - Symptomatic, <50% confined to bed  Vitals:   09/29/24 1414  BP: 135/87  Pulse: 91  Resp: 20  Temp: 97.9 F (36.6 C)  SpO2: 99%   Filed Weights   09/29/24 1414   Weight: 160 lb 3.2 oz (72.7 kg)   Body mass index is 23.66 kg/m.  GENERAL: alert, in no acute distress and comfortable SKIN: no acute rashes, no significant lesions EYES: conjunctiva are pink and non-injected, sclera anicteric OROPHARYNX: MMM, no exudates, no oropharyngeal erythema or ulceration NECK: supple, no JVD LYMPH: no palpable lymphadenopathy in the cervical, axillary or inguinal regions LUNGS: clear to auscultation b/l with normal respiratory effort HEART: regular rate & rhythm ABDOMEN: normoactive bowel sounds, non tender, not distended, no hepatosplenomegaly Extremity: no pedal edema PSYCH: alert & oriented x 3 with fluent speech NEURO: no focal motor/sensory deficits  LABORATORY DATA:  I have reviewed the data as listed     Latest Ref Rng & Units 09/24/2024    9:26 AM 09/16/2024    3:13 PM 07/01/2024    2:25 PM  CBC  WBC 4.0 - 10.5 K/uL 4.5  4.6  5.0   Hemoglobin 13.0 - 17.0 g/dL 86.4  85.5  85.2   Hematocrit 39.0 - 52.0 % 41.6  43.5  44.4   Platelets 150 - 400 K/uL 128  131  150        Latest Ref Rng & Units 09/16/2024    3:13 PM 09/14/2021    8:57 AM 09/06/2020    9:43 AM  CMP  Glucose 70 - 99 mg/dL 91  78  96   BUN 8 - 23 mg/dL 13  14  14    Creatinine 0.61 - 1.24 mg/dL 9.11  9.06  8.98   Sodium 135 - 145 mmol/L 138  141  139   Potassium 3.5 - 5.1 mmol/L 4.5  4.6  4.2   Chloride 98 - 111 mmol/L 103  103  101   CO2 22 - 32 mmol/L 30  26  25    Calcium 8.9 - 10.3 mg/dL 9.6  9.0  9.1   Total Protein 6.5 - 8.1 g/dL 7.8     Total Bilirubin 0.0 - 1.2 mg/dL 0.7     Alkaline Phos 38 - 126 U/L 133     AST 15 - 41 U/L 29     ALT 0 - 44  U/L 19      Component     Latest Ref Rng 07/01/2024 09/16/2024  Vitamin B12     180 - 914 pg/mL 192    Immature Platelet Fraction     1.2 - 8.6 % 2.5    HIV Screen 4th Generation wRfx     Non Reactive   Non Reactive   HCV Ab     NON REACTIVE   NON REACTIVE   Hepatitis B Surface Ag     NON REACTIVE   NON REACTIVE    LDH     98 - 192 U/L  231 (H)     Legend: (H) High  Narrative & Impression  Surgical Pathology CASE: [id number] PATIENT: Scott Newton Bone Marrow Report     Clinical History: lymphadenopathy, lymphoma work up and staging     DIAGNOSIS:  BONE MARROW, ASPIRATE, CLOT, CORE: - Hypercellular bone marrow (60%) involved by the patient's known CD5 positive B- cell lymphoma (approximately 10% cellularity).  See comment.  PERIPHERAL BLOOD: - Thrombocytopenia  COMMENT: The patient's history of a CD5 positive mature B-cell lymphoma with low-grade B-cell lymphoma FISH panel performed on the lymph node at [facility] positive for t(11,14), consistent with mantle cell lymphoma, is noted.  Findings on the bone marrow specimen are similar and reveal involvement by the patient's lymphoma to approximately 10% of the cellularity.  Additionally, flow cytometric analysis revealed a similar abnormal clonal B-cell population comprising 13% of lymphocytes.  The cells are positive for CD5, CD19, CD20, CD200, HLA-DR and express lambda light chains.  Correlation with pending cytogenetics is recommended for further assessment.  MICROSCOPIC DESCRIPTION:  PERIPHERAL BLOOD SMEAR: Platelets: Decreased, no platelet clumps identified Erythroid: Adequate Leukocytes: Adequate, negative for dysplastic granulocytes or blasts  BONE MARROW ASPIRATE: Cellular Erythroid precursors: Erythroid precursors show a full sequence of maturation Granulocytic precursors: Granulocytic precursors show a full sequence of maturation Megakaryocytes: Typical in number and morphology with occasional hypolobated forms Lymphocytes/plasma cells: Increased lymphocytes  TOUCH PREPARATIONS: Cellular, confirmatory of aspirate findings  CLOT AND BIOPSY: The bone marrow clot and biopsy reveal hypercellular bone marrow with trilineage hematopoiesis.  Lymphoid aggregates comprising approximately 10% of cellularity are  noted in the clot and biopsy section.  The findings are confirmatory of the aspirate and touch prep impression  SPECIAL STAINS: CD3: CD3 highlights T lymphocytes in the lymphoid aggregate CD20: CD20 highlights B lymphocytes in the lymphoid aggregate (more than CD3) PAX5: PAX5 highlights B lymphocytes in the lymphoid aggregate Cyclin D1: Cyclin D1 is positive in the lymphoid aggregate  IRON STAIN: Iron stains are performed on a bone marrow aspirate or touch imprint smear and section of clot. The controls stained appropriately.       Storage Iron: Scant      Ring Sideroblasts: Not identified  ADDITIONAL DATA/TESTING: Cytogenetics   CELL COUNT DATA:  Bone Marrow count performed on 500 cells shows: Blasts:   0%   Myeloid:  41% Promyelocytes: 1%   Erythroid:     45% Myelocytes:    5%   Lymphocytes:   13% Metamyelocytes:     6%   Plasma cells:  1% Bands:    4% Neutrophils:   20%  M:E ratio:     0.91 Eosinophils:   5% Basophils:     0% Monocytes:     0%  Lab Data: CBC performed on 09/24/2024 shows: WBC: 4.54 k/uL Neutrophils:   54% Hgb: 13.5 g/dL Lymphocytes:   67% HCT: 41.6 %    Monocytes:  10% MCV: 89.3 fL   Eosinophils:   2% RDW: 13.6 %    Basophils:     2% PLT: 128 k/uL    GROSS DESCRIPTION:  A: Aspirate smear  B: The specimen is received in B plus fixative, and consists of a 10.0 x 5.0 x 2.0 mm aggregate of red-brown clotted blood.  The specimen is entirely submitted in 1 cassette.  C: The specimen is received in B plus fixative, and consists of 2 cores of red-brown bone, measuring 0.3 and 0.8 cm in length by 0.2 cm in diameter.  The specimen is entirely submitted in 1 cassette following decalcification with Immunocal.  SHIRLEEN 09/24/2024)   Final Diagnosis performed by Ilsa Pottier, MD.   Electronically signed 09/29/2024 Technical and / or Professional components performed at [facility], [address] [place], [place] [address].   Immunohistochemistry Technical component (if applicable) was performed at [facility]. [address], [place], [place] [address].   IMMUNOHISTOCHEMISTRY DISCLAIMER (if applicable): Some of these immunohistochemical stains may have been developed and the performance characteristics determine by [facility]. Some may not have been cleared or approved by the U.S. Food and Drug Administration. The FDA has determined that such clearance or approval is not necessary. This test is used for clinical purposes. It should not be regarded as investigational or for research. This laboratory is certified under the Clinical Laboratory Improvement Amendments of 1988 (CLIA-88) as qualified to perform high complexity clinical laboratory testing.  The controls stained appropriately.   IHC stains are performed on formalin fixed, paraffin embedded tissue using a 3,3diaminobenzidine (DAB) chromogen and Leica Bond Autostainer System. The staining intensity of the nucleus is score manually and is reported as the percentage of tumor cell nuclei demonstrating specific nuclear staining. The specimens are fixed in 10% Neutral Formalin for at least 6 hours and up to 72hrs. These tests are validated on decalcified tissue. Results should be interpreted with caution given the possibility of false negative results on decalcified specimens. Antibody Clones are as follows ER-clone 46F, PR-clone 16, Ki67- clone MM1. Some of these immunohistochemical stains may have been developed and the performance characteristics determined by [facility].    RADIOGRAPHIC STUDIES: I have personally reviewed the radiological images as listed and agreed with the findings in the report. NM PET Image Initial (PI) Skull Base To Thigh (F-18 FDG) Result Date: 09/29/2024 EXAM: PET AND CT SKULL BASE TO MID THIGH 09/25/2024 08:49:01 AM TECHNIQUE: RADIOPHARMACEUTICAL: 7.94 mCi F-18 FDG Uptake time  60 minutes. Glucose level 94 mg/dl. PET imaging was acquired from the base of the skull to the mid thighs. Non-contrast enhanced computed tomography was obtained for attenuation correction and anatomic localization. COMPARISON: Neck and chest CT of 08/26/2024. abdominopelvic CT of 08/05/2024 also reviewed ([facility]). CLINICAL HISTORY: Hematologic malignancy, staging. FINDINGS: Blood pool activity SUV 2.4. Liver activity SUV 3.0. HEAD AND NECK: Bilateral hypermetabolic cervical nodes. Index low left jugular node measures 1.4 cm and SUV 6.4 on image 43/4. CHEST: Extensive bilateral axillary, mediastinal, and hilar hypermetabolic adenopathy. Index right paratracheal node measures 1.9 cm and SUV 8.7 on image 58/4. Mild cardiomegaly. Aortic and coronary artery atherosclerosis. No hypermetabolic pulmonary nodules. Scattered subpleural pulmonary nodules including within the right middle lobe measuring up to 7 mm are likely subpleural lymph nodes. ABDOMEN AND PELVIS: Mild splenic hypermetabolism relative to the liver at SUV 4.3. splenomegaly at 14.6 cm on prior diagnostic CT. Abdominal pelvic hypermetabolic adenopathy. Index left periaortic node  measures 1.8 cm and SUV 8.4 on image 132/4. A left external iliac / inguinal node measures 1.1 cm and SUV 5.8 on image 180/4. Normal adrenal glands. Bilateral renal cysts. Abdominal aortic atherosclerosis. Fat containing ventral abdominal wall hernia. Mild prostatomegaly with urolift implants and bilateral bladder diverticula. Physiologic activity within the gastrointestinal and genitourinary systems. BONES AND SOFT TISSUE: Right hip arthroplasty. No abnormal FDG activity localizes to the bones. No metabolically active aggressive osseous lesion. IMPRESSION: 1. Evidence of active nodal lymphoma within the neck, chest, abdomen, and pelvis as detailed above. 2. Mild splenic hypermetabolism and splenomegaly, suspicious for splenic involvement with lymphoma. 3. Incidental  findings, including: Aortic atherosclerosis ([id number]). Coronary artery atherosclerosis. Prostatomegaly with bladder outlet obstruction. Electronically signed by: Rockey Kilts MD 09/29/2024 09:05 AM EST RP Workstation: [id number]   IR LYMPH NODE CORE BIOPSY Result Date: 09/09/2024 INDICATION: 78 year old with lymphadenopathy. EXAM: ULTRASOUND-GUIDED CORE BIOPSY OF LEFT AXILLARY LYMPH NODE MEDICATIONS: 1% lidocaine  for local anesthetic ANESTHESIA/SEDATION: None FLUOROSCOPY TIME:  None COMPLICATIONS: None immediate. PROCEDURE: Informed written consent was obtained from the patient after a thorough discussion of the procedural risks, benefits and alternatives. All questions were addressed. Maximal Sterile Barrier Technique was utilized including caps, mask, sterile gowns, sterile gloves, sterile drape, hand hygiene and skin antiseptic. A timeout was performed prior to the initiation of the procedure. Left axillary region was evaluated with ultrasound. Enlarged lymph nodes identified. Prominent superficial lymph node was targeted. Left axillary region was prepped with chlorhexidine  and sterile field was created. Skin was anesthetized using 1% lidocaine . Small incision was made. Using ultrasound guidance, 17 gauge coaxial needle was directed into the lymph node. Multiple core biopsies were obtained with an 18 gauge core device. Specimens placed on a Telfa pad with saline. 17 gauge needle was removed. Bandage placed over the puncture site. FINDINGS: Enlarged left axillary and left cervical lymph nodes. Superficial enlarged left axillary lymph node was biopsied. Biopsy needle confirmed within the lymph node. No immediate bleeding or hematoma formation. IMPRESSION: Ultrasound-guided core biopsy of an enlarged left axillary lymph node. Electronically Signed   By: Juliene Balder M.D.   On: 09/09/2024 12:47    ASSESSMENT & PLAN:    78 y.o. 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  #2  recent gross hematuria #3 B12 deficiency  -on B12 replacement #4 chronic medical issues as below Patient Active Problem List   Diagnosis Date Noted   Generalized lymphadenopathy 09/16/2024   Thrombocytopenia 07/01/2024   Chronic anticoagulation 03/11/2018   Pre-operative cardiovascular examination 03/11/2018   Essential hypertension    Chronic atrial fibrillation (HCC)    Palpitations    ED (erectile dysfunction)    Umbilical hernia     PLAN:  -Discussed results from latest bone marrow biopsy and PET scan -discussed Diagnosis, natural history of Mantle cell lymphoma, possibly low-grade, stage 4 Discussed natural history, staging, treatment options and benefits/risks of different treatment options with the patient newly diagnosed -Discussed possible treatment methods in order to create a deep immuno response  -Treatment for stage 4 -Will plan to obtain molecular studies and genetic testing TP53,17p mutation testing on LN -Will begin a single agent chemotherapy drug, BR, split dose every two days over four weeks, up to six cycles -Will begin Rituxan in addition to chemotherapy treatment  -If it looks like WBC counts are taking longer to bounce back, a  G_CSF\ shot may be utilized -A repeat PET scan will be needed halfway through treatment  -  Discussed setting Scott Newton up with a separate education session with nurse  -BTK inibitors may be used as a second line of defense against lymphoma -Counseled to limit alcohol consumption while in treatment  -Put orders in for treatment in begin in the next few weeks, contingent on Scott Newton Thanksgiving plans   FOLLOW-UP  Plz schedule for chemo-counseling for BR ASAP Plz schedule to start BR in 2 weeks with labs and MD visit  The total time spent in the appointment was 60 minutes* . All of the patient's questions were answered and the patient knows to call the clinic with any problems, questions, or concerns.  Emaline Saran MD MS AAHIVMS [facility]  [facility] Hematology/Oncology Physician [facility]  *Total Encounter Time as defined by the Centers for Medicare and Medicaid Services includes, in addition to the face-to-face time of a patient visit (documented in the note above) non-face-to-face time: obtaining and reviewing outside history, ordering and reviewing medications, tests or procedures, care coordination (communications with other health care professionals or caregivers) and documentation in the medical record.  I, Damien Blanks, acting as a neurosurgeon for Emaline Saran, MD.,have documented all relevant documentation on the behalf of Emaline Saran, MD,as directed by  Emaline Saran, MD while in the presence of Emaline Saran, MD.  I have reviewed the above documentation for accuracy and completeness, and I agree with the above.  Janyiah Silveri, MD

## 2024-10-06 ENCOUNTER — Encounter: Payer: Self-pay | Admitting: Hematology

## 2024-10-06 DIAGNOSIS — R52 Pain, unspecified: Secondary | ICD-10-CM | POA: Diagnosis not present

## 2024-10-06 DIAGNOSIS — C8312 Mantle cell lymphoma, intrathoracic lymph nodes: Secondary | ICD-10-CM | POA: Insufficient documentation

## 2024-10-06 DIAGNOSIS — R5383 Other fatigue: Secondary | ICD-10-CM | POA: Diagnosis not present

## 2024-10-06 DIAGNOSIS — R509 Fever, unspecified: Secondary | ICD-10-CM | POA: Diagnosis not present

## 2024-10-06 MED ORDER — PROCHLORPERAZINE MALEATE 10 MG PO TABS
10.0000 mg | ORAL_TABLET | Freq: Four times a day (QID) | ORAL | 1 refills | Status: AC | PRN
Start: 2024-10-06 — End: ?

## 2024-10-06 MED ORDER — DEXAMETHASONE 4 MG PO TABS: 8.0000 mg | ORAL_TABLET | Freq: Every day | ORAL | 1 refills | Status: AC

## 2024-10-06 MED ORDER — ONDANSETRON HCL 8 MG PO TABS
8.0000 mg | ORAL_TABLET | Freq: Three times a day (TID) | ORAL | 1 refills | Status: AC | PRN
Start: 2024-10-06 — End: ?

## 2024-10-06 MED ORDER — ALLOPURINOL 100 MG PO TABS: 100.0000 mg | ORAL_TABLET | Freq: Two times a day (BID) | ORAL | 0 refills | Status: DC

## 2024-10-06 MED ORDER — ACYCLOVIR 400 MG PO TABS
400.0000 mg | ORAL_TABLET | Freq: Two times a day (BID) | ORAL | 6 refills | Status: AC
Start: 2024-10-06 — End: ?

## 2024-10-06 NOTE — Progress Notes (Signed)
 START ON PATHWAY REGIMEN - Lymphoma and CLL     Cycles 1 through 6: A cycle is every 28 days:     Acalabrutinib      Rituximab-xxxx      Bendamustine    Cycles 7 through 18: A cycle is every 56 days:     Rituximab-xxxx      Acalabrutinib    Cycles 19 and beyond: A cycle is every 28 days:     Acalabrutinib   **Always confirm dose/schedule in your pharmacy ordering system**  Patient Characteristics: Mantle Cell Lymphoma, Initial Therapy, Aggressive Disease or Treatment Indicated, Stage II - IV, Unfit or Age > 70, TP53 Mutation Not Present Disease Type: Not Applicable Disease Type: Mantle Cell Lymphoma Disease Type: Not Applicable Was TP53 Mutation Testing Completed<= No, TP53 Mutation Testing Not Completed Line of Therapy: Initial Therapy Intent of Therapy: Non-Curative / Palliative Intent, Discussed with Patient

## 2024-10-07 ENCOUNTER — Telehealth: Payer: Self-pay | Admitting: Hematology

## 2024-10-08 ENCOUNTER — Encounter: Payer: Self-pay | Admitting: Hematology

## 2024-10-08 ENCOUNTER — Encounter (HOSPITAL_COMMUNITY): Payer: Self-pay

## 2024-10-08 ENCOUNTER — Other Ambulatory Visit: Payer: Self-pay

## 2024-10-08 ENCOUNTER — Other Ambulatory Visit: Payer: Self-pay | Admitting: Physician Assistant

## 2024-10-08 ENCOUNTER — Telehealth: Payer: Self-pay

## 2024-10-08 ENCOUNTER — Other Ambulatory Visit (HOSPITAL_COMMUNITY): Payer: Self-pay

## 2024-10-08 NOTE — Telephone Encounter (Signed)
 Oral Oncology Patient Advocate Encounter  Prior Authorization for Ondansetron   has been approved.    Key [id number]  Effective dates: 10/08/24 through 11/18/24  Patients co-pay is $0.00.   Patient has been notified via MyChart  Charlott Hamilton,  CPhT-Adv  she/her/hers [facility]  [facility] Pharmacy Technician Patient Advocate Specialist III [facility] Phone: [phone]  Fax: [phone] [email or web address]

## 2024-10-08 NOTE — Telephone Encounter (Signed)
 Oral Oncology Patient Advocate Encounter   Received notification that prior authorization for Ondansetron  is required.   PA submitted on 10/07/24 Key [id number] Status is pending      Charlott Hamilton,  CPhT-Adv  she/her/hers [facility]  [facility] Pharmacy Technician Patient Advocate Specialist III [facility] Phone: [phone]  Fax: [phone] [email or web address]

## 2024-10-09 ENCOUNTER — Other Ambulatory Visit: Payer: Self-pay

## 2024-10-09 DIAGNOSIS — C8312 Mantle cell lymphoma, intrathoracic lymph nodes: Secondary | ICD-10-CM

## 2024-10-09 NOTE — Progress Notes (Signed)
 Pharmacist Chemotherapy Monitoring - Initial Assessment    Anticipated start date: 10/19/2024   The following has been reviewed per standard work regarding the patient's treatment regimen: The patient's diagnosis, treatment plan and drug doses, and organ/hematologic function Lab orders and baseline tests specific to treatment regimen  The treatment plan start date, drug sequencing, and pre-medications Prior authorization status  Patient's documented medication list, including drug-drug interaction screen and prescriptions for anti-emetics and supportive care specific to the treatment regimen The drug concentrations, fluid compatibility, administration routes, and timing of the medications to be used The patient's access for treatment and lifetime cumulative dose history, if applicable  The patient's medication allergies and previous infusion related reactions, if applicable   Changes made to treatment plan:  N/A  Follow up needed:  Hep B surface antigen complete f/u core antibody   Scott Newton, RPH, 10/09/2024  9:39 AM

## 2024-10-11 ENCOUNTER — Emergency Department (HOSPITAL_BASED_OUTPATIENT_CLINIC_OR_DEPARTMENT_OTHER)
Admission: EM | Admit: 2024-10-11 | Discharge: 2024-10-11 | Disposition: A | Discharge status: Home / Self Care | Attending: Emergency Medicine | Admitting: Emergency Medicine

## 2024-10-11 ENCOUNTER — Encounter (HOSPITAL_BASED_OUTPATIENT_CLINIC_OR_DEPARTMENT_OTHER): Payer: Self-pay

## 2024-10-11 ENCOUNTER — Other Ambulatory Visit: Payer: Self-pay

## 2024-10-11 DIAGNOSIS — N3091 Cystitis, unspecified with hematuria: Secondary | ICD-10-CM | POA: Diagnosis not present

## 2024-10-11 DIAGNOSIS — Z79899 Other long term (current) drug therapy: Secondary | ICD-10-CM | POA: Insufficient documentation

## 2024-10-11 DIAGNOSIS — I4891 Unspecified atrial fibrillation: Secondary | ICD-10-CM | POA: Diagnosis not present

## 2024-10-11 DIAGNOSIS — C8310 Mantle cell lymphoma, unspecified site: Secondary | ICD-10-CM | POA: Diagnosis not present

## 2024-10-11 DIAGNOSIS — I10 Essential (primary) hypertension: Secondary | ICD-10-CM | POA: Diagnosis not present

## 2024-10-11 DIAGNOSIS — R310 Gross hematuria: Secondary | ICD-10-CM

## 2024-10-11 DIAGNOSIS — Z7901 Long term (current) use of anticoagulants: Secondary | ICD-10-CM | POA: Insufficient documentation

## 2024-10-11 DIAGNOSIS — R350 Frequency of micturition: Secondary | ICD-10-CM | POA: Diagnosis present

## 2024-10-11 DIAGNOSIS — R030 Elevated blood-pressure reading, without diagnosis of hypertension: Secondary | ICD-10-CM

## 2024-10-11 DIAGNOSIS — N3001 Acute cystitis with hematuria: Secondary | ICD-10-CM | POA: Diagnosis not present

## 2024-10-11 LAB — URINALYSIS, ROUTINE W REFLEX MICROSCOPIC
RBC / HPF: >50 RBC/hpf (ref 0–5)
WBC, UA: >50 WBC/hpf (ref 0–5)

## 2024-10-11 LAB — COMPREHENSIVE METABOLIC PANEL WITH GFR
ALT: 16 U/L (ref 0–44)
AST: 25 U/L (ref 15–41)
Albumin: 3.4 g/dL — ABNORMAL LOW (ref 3.5–5.0)
Alkaline Phosphatase: 150 U/L — ABNORMAL HIGH (ref 38–126)
Anion gap: 12 (ref 5–15)
BUN: 17 mg/dL (ref 8–23)
CO2: 23 mmol/L (ref 22–32)
Calcium: 9.1 mg/dL (ref 8.9–10.3)
Chloride: 99 mmol/L (ref 98–111)
Creatinine, Ser: 0.74 mg/dL (ref 0.61–1.24)
GFR, Estimated: >60 mL/min (ref >60)
Glucose, Bld: 104 mg/dL — ABNORMAL HIGH (ref 70–99)
Potassium: 4.1 mmol/L (ref 3.5–5.1)
Sodium: 134 mmol/L — ABNORMAL LOW (ref 135–145)
Total Bilirubin: 0.7 mg/dL (ref 0.0–1.2)
Total Protein: 6.8 g/dL (ref 6.5–8.1)

## 2024-10-11 LAB — CBC
HCT: 38.1 % — ABNORMAL LOW (ref 39.0–52.0)
Hemoglobin: 12.8 g/dL — ABNORMAL LOW (ref 13.0–17.0)
MCH: 29.3 pg (ref 26.0–34.0)
MCHC: 33.6 g/dL (ref 30.0–36.0)
MCV: 87.2 fL (ref 80.0–100.0)
Platelets: 183 K/uL (ref 150–400)
RBC: 4.37 MIL/uL (ref 4.22–5.81)
RDW: 13.1 % (ref 11.5–15.5)
WBC: 6 K/uL (ref 4.0–10.5)
nRBC: 0 % (ref 0.0–0.2)

## 2024-10-11 MED ORDER — CEPHALEXIN 250 MG PO CAPS
1000.0000 mg | ORAL_CAPSULE | Freq: Once | ORAL | Status: AC
  Administered 2024-10-11: 1000 mg via ORAL
  Filled 2024-10-11: qty 4

## 2024-10-11 MED ORDER — CEPHALEXIN 500 MG PO CAPS: 500.0000 mg | ORAL_CAPSULE | Freq: Four times a day (QID) | ORAL | 0 refills | Status: DC

## 2024-10-11 NOTE — ED Provider Notes (Signed)
 [place] EMERGENCY DEPARTMENT AT [facility] Provider Note   CSN: [id number] Arrival date & time: 10/11/24  9042     Patient presents with: Hematuria and Urinary Frequency   Scott Newton is a 78 y.o. male.   Pt c/o hematuria - history of same, saw urology as couple months ago for same, had ct imaging and subsequent cystoscopy with cauterization 'of a few spots'.  Remote hx urolift procedure.  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  Feels is able to urinate/empty bladder. No abd pain/distension. No back/flank pain. No fever or chills. No scrotal or testicular pain. On eliquis , hx afib. No other abnormal bleeding, melena/rectal bleeding, nosebleeds, etc.   The history is provided by the patient and medical records.  Hematuria Pertinent negatives include no abdominal pain, no headaches and no shortness of breath.  Urinary Frequency Pertinent negatives include no abdominal pain, no headaches and no shortness of breath.       Prior to Admission medications   Medication Sig Start Date End Date Taking? Authorizing Provider  cephALEXin  (KEFLEX ) 500 MG capsule Take 1 capsule (500 mg total) by mouth 4 (four) times daily. 10/11/24  Yes Bernard Drivers, MD  acetaminophen  (TYLENOL ) 500 MG tablet Take 500 mg by mouth every 6 (six) hours as needed for moderate pain or headache.    [provider]  acyclovir  (ZOVIRAX ) 400 MG tablet Take 1 tablet (400 mg total) by mouth 2 (two) times daily. 10/06/24   Onesimo Emaline Brink, MD  allopurinol  (ZYLOPRIM ) 100 MG tablet Take 1 tablet (100 mg total) by mouth 2 (two) times daily. 10/06/24   Onesimo Emaline Brink, MD  apixaban  (ELIQUIS ) 5 MG TABS tablet Take 1 tablet (5 mg total) by mouth 2 (two) times daily. 10/01/23   Lucien Orren SAILOR, PA-C  dexamethasone  (DECADRON ) 4 MG tablet Take 2 tablets (8 mg total) by mouth daily. Start the day after bendamustine chemotherapy for 2 days. Take with food.  10/06/24   Kale, Gautam Kishore, MD  finasteride (PROSCAR) 5 MG tablet Take 5 mg by mouth daily. 09/26/23   [provider]  Magnesium 250 MG TABS Take 250 mg by mouth daily at 6 (six) AM.    [provider]  metoprolol  succinate (TOPROL -XL) 50 MG 24 hr tablet TAKE 1.5 TABLETS EVERY MORNING AND 0.5 TAB EVERY EVENING TAKE WITH OR IMMEDIATELY FOLLOWING A MEAL. 10/01/23   Conte, Tessa N, PA-C  Multiple Vitamins-Minerals (MULTIVITAMIN WITH MINERALS) tablet Take 1 tablet by mouth 2 (two) times a week.     [provider]  ondansetron  (ZOFRAN ) 8 MG tablet Take 1 tablet (8 mg total) by mouth every 8 (eight) hours as needed for nausea or vomiting. Start on the third day after chemotherapy. 10/06/24   Onesimo Emaline Brink, MD  prochlorperazine  (COMPAZINE ) 10 MG tablet Take 1 tablet (10 mg total) by mouth every 6 (six) hours as needed for nausea or vomiting. 10/06/24   Kale, Gautam Kishore, MD  sildenafil (REVATIO) 20 MG tablet Take 20-100 mg by mouth daily as needed for erectile dysfunction. 05/22/19   [provider]  tamsulosin (FLOMAX) 0.4 MG CAPS capsule Take 0.4 mg by mouth daily. 09/26/23   [provider]    Allergies: Patient has no known allergies.    Review of Systems  Constitutional:  Negative for chills and fever.  HENT:  Negative for nosebleeds.   Respiratory:  Negative for shortness of breath.   Gastrointestinal:  Negative  for abdominal pain and blood in stool.  Genitourinary:  Positive for hematuria. Negative for dysuria and flank pain.  Musculoskeletal:  Negative for back pain.  Neurological:  Negative for syncope and headaches.    Updated Vital Signs BP (!) 123/103   Pulse 92   Temp 97.9 F (36.6 C) (Oral)   Resp 18   Ht 1.753 m (5' 9)   Wt 72.7 kg   SpO2 100%   BMI 23.67 kg/m   Physical Exam Vitals and nursing note reviewed.  Constitutional:      Appearance: Normal appearance. He is well-developed.  HENT:     Head: Atraumatic.      Nose: Nose normal.  Eyes:     General: No scleral icterus.    Conjunctiva/sclera: Conjunctivae normal.  Neck:     Trachea: No tracheal deviation.  Cardiovascular:     Rate and Rhythm: Normal rate.     Pulses: Normal pulses.  Pulmonary:     Effort: Pulmonary effort is normal. No accessory muscle usage or respiratory distress.  Abdominal:     General: Bowel sounds are normal. There is no distension.     Palpations: Abdomen is soft. There is no mass.     Tenderness: There is no abdominal tenderness. There is no guarding.  Genitourinary:    Comments: Normal external gu exam. No scrotal or testicular swelling, pain or tenderness.  Musculoskeletal:        General: No swelling.     Cervical back: Neck supple.  Skin:    General: Skin is warm and dry.     Findings: No rash.  Neurological:     Mental Status: He is alert.     Comments: Alert, speech clear.   Psychiatric:        Mood and Affect: Mood normal.     (all labs ordered are listed, but only abnormal results are displayed) Results for orders placed or performed during the hospital encounter of 10/11/24  Urinalysis, Routine w reflex microscopic -Urine, Clean Catch   Collection Time: 10/11/24 10:11 AM  Result Value Ref Range   Color, Urine RED (A) YELLOW   APPearance TURBID (A) CLEAR   Specific Gravity, Urine  1.005 - 1.030    TEST NOT REPORTED DUE TO COLOR INTERFERENCE OF URINE PIGMENT   pH  5.0 - 8.0    TEST NOT REPORTED DUE TO COLOR INTERFERENCE OF URINE PIGMENT   Glucose, UA (A) NEGATIVE mg/dL    TEST NOT REPORTED DUE TO COLOR INTERFERENCE OF URINE PIGMENT   Hgb urine dipstick (A) NEGATIVE    TEST NOT REPORTED DUE TO COLOR INTERFERENCE OF URINE PIGMENT   Bilirubin Urine (A) NEGATIVE    TEST NOT REPORTED DUE TO COLOR INTERFERENCE OF URINE PIGMENT   Ketones, ur (A) NEGATIVE mg/dL    TEST NOT REPORTED DUE TO COLOR INTERFERENCE OF URINE PIGMENT   Protein, ur (A) NEGATIVE mg/dL    TEST NOT REPORTED DUE TO COLOR  INTERFERENCE OF URINE PIGMENT   Nitrite (A) NEGATIVE    TEST NOT REPORTED DUE TO COLOR INTERFERENCE OF URINE PIGMENT   Leukocytes,Ua (A) NEGATIVE    TEST NOT REPORTED DUE TO COLOR INTERFERENCE OF URINE PIGMENT   RBC / HPF >50 0 - 5 RBC/hpf   WBC, UA >50 0 - 5 WBC/hpf   Bacteria, UA MANY (A) NONE SEEN   Squamous Epithelial / HPF 0-5 0 - 5 /HPF   WBC Clumps PRESENT    Mucus PRESENT  Non Squamous Epithelial 0-5 (A) NONE SEEN  CBC   Collection Time: 10/11/24 10:24 AM  Result Value Ref Range   WBC 6.0 4.0 - 10.5 K/uL   RBC 4.37 4.22 - 5.81 MIL/uL   Hemoglobin 12.8 (L) 13.0 - 17.0 g/dL   HCT 61.8 (L) 60.9 - 47.9 %   MCV 87.2 80.0 - 100.0 fL   MCH 29.3 26.0 - 34.0 pg   MCHC 33.6 30.0 - 36.0 g/dL   RDW 86.8 88.4 - 84.4 %   Platelets 183 150 - 400 K/uL   nRBC 0.0 0.0 - 0.2 %  Comprehensive metabolic panel with GFR   Collection Time: 10/11/24 10:24 AM  Result Value Ref Range   Sodium 134 (L) 135 - 145 mmol/L   Potassium 4.1 3.5 - 5.1 mmol/L   Chloride 99 98 - 111 mmol/L   CO2 23 22 - 32 mmol/L   Glucose, Bld 104 (H) 70 - 99 mg/dL   BUN 17 8 - 23 mg/dL   Creatinine, Ser 9.25 0.61 - 1.24 mg/dL   Calcium 9.1 8.9 - 89.6 mg/dL   Total Protein 6.8 6.5 - 8.1 g/dL   Albumin 3.4 (L) 3.5 - 5.0 g/dL   AST 25 15 - 41 U/L   ALT 16 0 - 44 U/L   Alkaline Phosphatase 150 (H) 38 - 126 U/L   Total Bilirubin 0.7 0.0 - 1.2 mg/dL   GFR, Estimated >39 >39 mL/min   Anion gap 12 5 - 15   NM PET Image Initial (PI) Skull Base To Thigh (F-18 FDG) Result Date: 09/29/2024 EXAM: PET AND CT SKULL BASE TO MID THIGH 09/25/2024 08:49:01 AM TECHNIQUE: RADIOPHARMACEUTICAL: 7.94 mCi F-18 FDG Uptake time 60 minutes. Glucose level 94 mg/dl. PET imaging was acquired from the base of the skull to the mid thighs. Non-contrast enhanced computed tomography was obtained for attenuation correction and anatomic localization. COMPARISON: Neck and chest CT of 08/26/2024. abdominopelvic CT of 08/05/2024 also reviewed ([facility]). CLINICAL HISTORY: Hematologic malignancy, staging. FINDINGS: Blood pool activity SUV 2.4. Liver activity SUV 3.0. HEAD AND NECK: Bilateral hypermetabolic cervical nodes. Index low left jugular node measures 1.4 cm and SUV 6.4 on image 43/4. CHEST: Extensive bilateral axillary, mediastinal, and hilar hypermetabolic adenopathy. Index right paratracheal node measures 1.9 cm and SUV 8.7 on image 58/4. Mild cardiomegaly. Aortic and coronary artery atherosclerosis. No hypermetabolic pulmonary nodules. Scattered subpleural pulmonary nodules including within the right middle lobe measuring up to 7 mm are likely subpleural lymph nodes. ABDOMEN AND PELVIS: Mild splenic hypermetabolism relative to the liver at SUV 4.3. splenomegaly at 14.6 cm on prior diagnostic CT. Abdominal pelvic hypermetabolic adenopathy. Index left periaortic node measures 1.8 cm and SUV 8.4 on image 132/4. A left external iliac / inguinal node measures 1.1 cm and SUV 5.8 on image 180/4. Normal adrenal glands. Bilateral renal cysts. Abdominal aortic atherosclerosis. Fat containing ventral abdominal wall hernia. Mild prostatomegaly with urolift implants and bilateral bladder diverticula. Physiologic activity within the gastrointestinal and genitourinary systems. BONES AND SOFT TISSUE: Right hip arthroplasty. No abnormal FDG activity localizes to the bones. No metabolically active aggressive osseous lesion. IMPRESSION: 1. Evidence of active nodal lymphoma within the neck, chest, abdomen, and pelvis as detailed above. 2. Mild splenic hypermetabolism and splenomegaly, suspicious for splenic involvement with lymphoma. 3. Incidental findings, including: Aortic atherosclerosis (icd10-i70.0). Coronary artery atherosclerosis. Prostatomegaly with bladder outlet obstruction. Electronically signed by: Rockey Kilts MD 09/29/2024 09:05 AM EST RP Workstation: [id number]    EKG: None  Radiology: No results found.   Procedures   Medications Ordered in  the ED  cephALEXin  (KEFLEX ) capsule 1,000 mg (1,000 mg Oral Given 10/11/24 1128)                                    Medical Decision Making Problems Addressed: Elevated blood pressure reading: acute illness or injury Essential hypertension: chronic illness or injury Gross hematuria: acute illness or injury with systemic symptoms Hemorrhagic cystitis: acute illness or injury with systemic symptoms that poses a threat to life or bodily functions Mantle cell lymphoma, unspecified body region [facility]): chronic illness or injury with exacerbation, progression, or side effects of treatment that poses a threat to life or bodily functions  Amount and/or Complexity of Data Reviewed External Data Reviewed: notes. Labs: ordered. Decision-making details documented in ED Course. Radiology: independent interpretation performed. Decision-making details documented in ED Course.  Risk Prescription drug management. Decision regarding hospitalization.   Labs ordered/sent. Imaging reviewed.   Differential diagnosis includes hematuria, uti, etc. Dispo decision including potential need for admission considered - will get labs and review recent prior imaging and reassess.   Reviewed nursing notes and prior charts for additional history. External reports reviewed.   Labs reviewed/interpreted by me - wbc 6, hgb 13. Chem largely unremarkable. Ua w many rbc and wbcs and bacteria. Will culture. Keflex  po.   Recent CT reviewed/interpreted by me - no bladder mass. Bladder diverticula.   Rec close pcp f/u.  Return precautions provided.       Final diagnoses:  Gross hematuria  Hemorrhagic cystitis  Elevated blood pressure reading  Mantle cell lymphoma, unspecified body region [facility])  Essential hypertension    ED Discharge Orders          Ordered    cephALEXin  (KEFLEX ) 500 MG capsule  4 times daily        10/11/24 1146               Bernard Drivers, MD 10/11/24 1147

## 2024-10-11 NOTE — ED Triage Notes (Signed)
 Arrives ambulatory to the ED with complaints of blood in his urine with frequent urination overnight. Patient reports that he's had several urological procedures and he takes Flomax chronically.

## 2024-10-11 NOTE — Discharge Instructions (Signed)
 It was our pleasure to provide your ER care today - we hope that you feel better.  Drink plenty of fluids/stay well hydrated. Take antibiotic (keflex ) as prescribed. Hold the next two doses of your eliquis .   Follow up closely with your doctor/urologist in the next 1-2 weeks.  Your blood pressure is high today - follow up with primary care doctor in the next few weeks.   Return to ER if worse, new symptoms, fevers, new or severe abdominal pain, unable to void/unable to empty bladder, persistent vomiting, weak/fainting, or other concern.

## 2024-10-12 ENCOUNTER — Inpatient Hospital Stay

## 2024-10-12 DIAGNOSIS — C8312 Mantle cell lymphoma, intrathoracic lymph nodes: Secondary | ICD-10-CM

## 2024-10-12 LAB — CMP (CANCER CENTER ONLY)
ALT: 20 U/L (ref 0–44)
AST: 30 U/L (ref 15–41)
Albumin: 4 g/dL (ref 3.5–5.0)
Alkaline Phosphatase: 170 U/L — ABNORMAL HIGH (ref 38–126)
Anion gap: 10 (ref 5–15)
BUN: 19 mg/dL (ref 8–23)
CO2: 27 mmol/L (ref 22–32)
Calcium: 9.1 mg/dL (ref 8.9–10.3)
Chloride: 99 mmol/L (ref 98–111)
Creatinine: 0.83 mg/dL (ref 0.61–1.24)
GFR, Estimated: >60 mL/min (ref >60)
Glucose, Bld: 106 mg/dL — ABNORMAL HIGH (ref 70–99)
Potassium: 4.2 mmol/L (ref 3.5–5.1)
Sodium: 136 mmol/L (ref 135–145)
Total Bilirubin: 0.6 mg/dL (ref 0.0–1.2)
Total Protein: 7.7 g/dL (ref 6.5–8.1)

## 2024-10-12 LAB — CBC WITH DIFFERENTIAL (CANCER CENTER ONLY)
Abs Immature Granulocytes: 0.05 K/uL (ref 0.00–0.07)
Basophils Absolute: 0 K/uL (ref 0.0–0.1)
Basophils Relative: 0 %
Eosinophils Absolute: 0.1 K/uL (ref 0.0–0.5)
Eosinophils Relative: 2 %
HCT: 43.4 % (ref 39.0–52.0)
Hemoglobin: 14.4 g/dL (ref 13.0–17.0)
Immature Granulocytes: 1 %
Lymphocytes Relative: 18 %
Lymphs Abs: 1.1 K/uL (ref 0.7–4.0)
MCH: 28.7 pg (ref 26.0–34.0)
MCHC: 33.2 g/dL (ref 30.0–36.0)
MCV: 86.5 fL (ref 80.0–100.0)
Monocytes Absolute: 0.7 K/uL (ref 0.1–1.0)
Monocytes Relative: 12 %
Neutro Abs: 3.8 K/uL (ref 1.7–7.7)
Neutrophils Relative %: 67 %
Platelet Count: 236 K/uL (ref 150–400)
RBC: 5.02 MIL/uL (ref 4.22–5.81)
RDW: 12.9 % (ref 11.5–15.5)
WBC Count: 5.8 K/uL (ref 4.0–10.5)
nRBC: 0 % (ref 0.0–0.2)

## 2024-10-12 MED ORDER — METOPROLOL SUCCINATE ER 50 MG PO TB24: ORAL_TABLET | ORAL | 0 refills | Status: AC

## 2024-10-13 LAB — URINE CULTURE: Culture: >=100000 — AB

## 2024-10-13 NOTE — Progress Notes (Signed)
 Patient will start chemotherapy 10/19/24. Will continue to monitor for needs.

## 2024-10-14 ENCOUNTER — Telehealth (HOSPITAL_BASED_OUTPATIENT_CLINIC_OR_DEPARTMENT_OTHER): Payer: Self-pay | Admitting: *Deleted

## 2024-10-14 NOTE — Telephone Encounter (Signed)
 Post ED Visit - Positive Culture Follow-up  Culture report reviewed by antimicrobial stewardship pharmacist: [facility] [x]  [place], [place].D. []  Venetia Gully, Pharm.D., BCPS AQ-ID []  Garrel Crews, Pharm.D., BCPS []  Almarie Lunger, Pharm.D., BCPS []  [place], [place].D., BCPS, AAHIVP []  Rosaline Bihari, Pharm.D., BCPS, AAHIVP []  Vernell Meier, PharmD, BCPS []  Latanya Hint, PharmD, BCPS []  Donald Medley, PharmD, BCPS []  Rocky Bold, PharmD []  Dorothyann Alert, PharmD, BCPS []  Morene Babe, PharmD  [facility] []  Rosaline Edison, PharmD []  Romona Bliss, PharmD []  Dolphus Roller, PharmD []  Veva Seip, Rph []  Vernell Daunt) Leonce, PharmD []  Eva Allis, PharmD []  Rosaline Millet, PharmD []  Iantha Batch, PharmD []  Arvin Gauss, PharmD []  Wanda Hasting, PharmD []  Ronal Rav, PharmD []  Rocky Slade, PharmD []  Bard Jeans, PharmD   Positive urine culture Treated with cephalexin , organism sensitive to the same and no further patient follow-up is required at this time.  Jama Wyman Kipper 10/14/2024, 9:39 AM

## 2024-10-19 ENCOUNTER — Encounter: Payer: Self-pay | Admitting: Hematology

## 2024-10-19 ENCOUNTER — Inpatient Hospital Stay: Attending: Hematology and Oncology

## 2024-10-19 ENCOUNTER — Other Ambulatory Visit: Payer: Self-pay

## 2024-10-19 VITALS — BP 124/69 | HR 89 | Temp 97.8°F | Resp 16 | Wt 154.8 lb

## 2024-10-19 DIAGNOSIS — C8312 Mantle cell lymphoma, intrathoracic lymph nodes: Secondary | ICD-10-CM

## 2024-10-19 DIAGNOSIS — Z8042 Family history of malignant neoplasm of prostate: Secondary | ICD-10-CM | POA: Diagnosis not present

## 2024-10-19 DIAGNOSIS — Z79624 Long term (current) use of inhibitors of nucleotide synthesis: Secondary | ICD-10-CM | POA: Diagnosis not present

## 2024-10-19 DIAGNOSIS — Z79899 Other long term (current) drug therapy: Secondary | ICD-10-CM | POA: Insufficient documentation

## 2024-10-19 DIAGNOSIS — Z8249 Family history of ischemic heart disease and other diseases of the circulatory system: Secondary | ICD-10-CM | POA: Insufficient documentation

## 2024-10-19 DIAGNOSIS — C8318 Mantle cell lymphoma, lymph nodes of multiple sites: Secondary | ICD-10-CM | POA: Insufficient documentation

## 2024-10-19 DIAGNOSIS — R161 Splenomegaly, not elsewhere classified: Secondary | ICD-10-CM | POA: Diagnosis not present

## 2024-10-19 DIAGNOSIS — Z9089 Acquired absence of other organs: Secondary | ICD-10-CM | POA: Diagnosis not present

## 2024-10-19 DIAGNOSIS — Z7901 Long term (current) use of anticoagulants: Secondary | ICD-10-CM | POA: Diagnosis not present

## 2024-10-19 DIAGNOSIS — R0602 Shortness of breath: Secondary | ICD-10-CM | POA: Insufficient documentation

## 2024-10-19 DIAGNOSIS — Z79620 Long term (current) use of immunosuppressive biologic: Secondary | ICD-10-CM | POA: Diagnosis not present

## 2024-10-19 DIAGNOSIS — Z5111 Encounter for antineoplastic chemotherapy: Secondary | ICD-10-CM | POA: Insufficient documentation

## 2024-10-19 DIAGNOSIS — N401 Enlarged prostate with lower urinary tract symptoms: Secondary | ICD-10-CM | POA: Diagnosis not present

## 2024-10-19 DIAGNOSIS — Z79630 Long term (current) use of alkylating agent: Secondary | ICD-10-CM | POA: Diagnosis not present

## 2024-10-19 DIAGNOSIS — Z7952 Long term (current) use of systemic steroids: Secondary | ICD-10-CM | POA: Diagnosis not present

## 2024-10-19 DIAGNOSIS — F17290 Nicotine dependence, other tobacco product, uncomplicated: Secondary | ICD-10-CM | POA: Insufficient documentation

## 2024-10-19 DIAGNOSIS — I2510 Atherosclerotic heart disease of native coronary artery without angina pectoris: Secondary | ICD-10-CM | POA: Insufficient documentation

## 2024-10-19 DIAGNOSIS — D696 Thrombocytopenia, unspecified: Secondary | ICD-10-CM | POA: Diagnosis not present

## 2024-10-19 DIAGNOSIS — R310 Gross hematuria: Secondary | ICD-10-CM | POA: Insufficient documentation

## 2024-10-19 DIAGNOSIS — Z5112 Encounter for antineoplastic immunotherapy: Secondary | ICD-10-CM | POA: Diagnosis present

## 2024-10-19 DIAGNOSIS — F17210 Nicotine dependence, cigarettes, uncomplicated: Secondary | ICD-10-CM | POA: Diagnosis not present

## 2024-10-19 DIAGNOSIS — E538 Deficiency of other specified B group vitamins: Secondary | ICD-10-CM | POA: Insufficient documentation

## 2024-10-19 DIAGNOSIS — I10 Essential (primary) hypertension: Secondary | ICD-10-CM | POA: Insufficient documentation

## 2024-10-19 DIAGNOSIS — I482 Chronic atrial fibrillation, unspecified: Secondary | ICD-10-CM | POA: Diagnosis not present

## 2024-10-19 LAB — CMP (CANCER CENTER ONLY)
ALT: 20 U/L (ref 0–44)
AST: 41 U/L (ref 15–41)
Albumin: 3.8 g/dL (ref 3.5–5.0)
Alkaline Phosphatase: 141 U/L — ABNORMAL HIGH (ref 38–126)
Anion gap: 7 (ref 5–15)
BUN: 12 mg/dL (ref 8–23)
CO2: 25 mmol/L (ref 22–32)
Calcium: 8.7 mg/dL — ABNORMAL LOW (ref 8.9–10.3)
Chloride: 105 mmol/L (ref 98–111)
Creatinine: 0.83 mg/dL (ref 0.61–1.24)
GFR, Estimated: >60 mL/min (ref >60)
Glucose, Bld: 146 mg/dL — ABNORMAL HIGH (ref 70–99)
Potassium: 4.7 mmol/L (ref 3.5–5.1)
Sodium: 137 mmol/L (ref 135–145)
Total Bilirubin: 0.3 mg/dL (ref 0.0–1.2)
Total Protein: 6.9 g/dL (ref 6.5–8.1)

## 2024-10-19 LAB — CBC WITH DIFFERENTIAL (CANCER CENTER ONLY)
Abs Immature Granulocytes: 0.03 K/uL (ref 0.00–0.07)
Basophils Absolute: 0 K/uL (ref 0.0–0.1)
Basophils Relative: 0 %
Eosinophils Absolute: 0 K/uL (ref 0.0–0.5)
Eosinophils Relative: 0 %
HCT: 38.9 % — ABNORMAL LOW (ref 39.0–52.0)
Hemoglobin: 13 g/dL (ref 13.0–17.0)
Immature Granulocytes: 1 %
Lymphocytes Relative: 6 %
Lymphs Abs: 0.4 K/uL — ABNORMAL LOW (ref 0.7–4.0)
MCH: 29.3 pg (ref 26.0–34.0)
MCHC: 33.4 g/dL (ref 30.0–36.0)
MCV: 87.6 fL (ref 80.0–100.0)
Monocytes Absolute: 0.2 K/uL (ref 0.1–1.0)
Monocytes Relative: 3 %
Neutro Abs: 5.9 K/uL (ref 1.7–7.7)
Neutrophils Relative %: 90 %
Platelet Count: 188 K/uL (ref 150–400)
RBC: 4.44 MIL/uL (ref 4.22–5.81)
RDW: 13.2 % (ref 11.5–15.5)
WBC Count: 6.5 K/uL (ref 4.0–10.5)
nRBC: 0 % (ref 0.0–0.2)

## 2024-10-19 MED ORDER — SODIUM CHLORIDE 0.9 % IV SOLN
90.0000 mg/m2 | Freq: Once | INTRAVENOUS | Status: AC
  Administered 2024-10-19: 170 mg via INTRAVENOUS
  Filled 2024-10-19: qty 6.8

## 2024-10-19 MED ORDER — DIPHENHYDRAMINE HCL 25 MG PO CAPS
50.0000 mg | ORAL_CAPSULE | Freq: Once | ORAL | Status: AC
  Administered 2024-10-19: 50 mg via ORAL
  Filled 2024-10-19: qty 2

## 2024-10-19 MED ORDER — SODIUM CHLORIDE 0.9 % IV SOLN
375.0000 mg/m2 | Freq: Once | INTRAVENOUS | Status: AC
  Administered 2024-10-19: 700 mg via INTRAVENOUS
  Filled 2024-10-19: qty 50

## 2024-10-19 MED ORDER — ACETAMINOPHEN 325 MG PO TABS
650.0000 mg | ORAL_TABLET | Freq: Once | ORAL | Status: AC
  Administered 2024-10-19: 650 mg via ORAL
  Filled 2024-10-19: qty 2

## 2024-10-19 MED ORDER — PALONOSETRON HCL INJECTION 0.25 MG/5ML
0.2500 mg | Freq: Once | INTRAVENOUS | Status: AC
  Administered 2024-10-19: 0.25 mg via INTRAVENOUS
  Filled 2024-10-19: qty 5

## 2024-10-19 MED ORDER — DEXAMETHASONE SOD PHOSPHATE PF 10 MG/ML IJ SOLN
10.0000 mg | Freq: Once | INTRAMUSCULAR | Status: AC
  Administered 2024-10-19: 10 mg via INTRAVENOUS

## 2024-10-19 MED ORDER — SODIUM CHLORIDE 0.9 % IV SOLN: INTRAVENOUS | Status: DC

## 2024-10-19 NOTE — Patient Instructions (Signed)
 [facility] - A DEPT OF [facility]  [facility]  Discharge Instructions: Thank you for choosing [facility] to provide your oncology and hematology care.   If you have a lab appointment with the Cancer Center, please go directly to the Cancer Center and check in at the registration area.   Wear comfortable clothing and clothing appropriate for easy access to any Portacath or PICC line.   We strive to give you quality time with your provider. You may need to reschedule your appointment if you arrive late (15 or more minutes).  Arriving late affects you and other patients whose appointments are after yours.  Also, if you miss three or more appointments without notifying the office, you may be dismissed from the clinic at the provider's discretion.      For prescription refill requests, have your pharmacy contact our office and allow 72 hours for refills to be completed.    Today you received the following chemotherapy and/or immunotherapy agents Rituximab, Bendamustine      To help prevent nausea and vomiting after your treatment, we encourage you to take your nausea medication as directed.  BELOW ARE SYMPTOMS THAT SHOULD BE REPORTED IMMEDIATELY: *FEVER GREATER THAN 100.4 F (38 C) OR HIGHER *CHILLS OR SWEATING *NAUSEA AND VOMITING THAT IS NOT CONTROLLED WITH YOUR NAUSEA MEDICATION *UNUSUAL SHORTNESS OF BREATH *UNUSUAL BRUISING OR BLEEDING *URINARY PROBLEMS (pain or burning when urinating, or frequent urination) *BOWEL PROBLEMS (unusual diarrhea, constipation, pain near the anus) TENDERNESS IN MOUTH AND THROAT WITH OR WITHOUT PRESENCE OF ULCERS (sore throat, sores in mouth, or a toothache) UNUSUAL RASH, SWELLING OR PAIN  UNUSUAL VAGINAL DISCHARGE OR ITCHING   Items with * indicate a potential emergency and should be followed up as soon as possible or go to the Emergency Department if any problems should occur.  Please show the CHEMOTHERAPY ALERT CARD or  IMMUNOTHERAPY ALERT CARD at check-in to the Emergency Department and triage nurse.  Should you have questions after your visit or need to cancel or reschedule your appointment, please contact [facility] - A DEPT OF [facility]  Dept: [phone]  and follow the prompts.  Office hours are 8:00 a.m. to 4:30 p.m. Monday - Friday. Please note that voicemails left after 4:00 p.m. may not be returned until the following business day.  We are closed weekends and major holidays. You have access to a nurse at all times for urgent questions. Please call the main number to the clinic Dept: [phone] and follow the prompts.   For any non-urgent questions, you may also contact your provider using MyChart. We now offer e-Visits for anyone 43 and older to request care online for non-urgent symptoms. For details visit [email or web address].   Also download the MyChart app! Go to the app store, search MyChart, open the app, select [place], and log in with your MyChart username and password.  Rituximab Injection What is this medication? RITUXIMAB (ri TUX i mab) treats leukemia and lymphoma. It works by blocking a protein that causes cancer cells to grow and multiply. This helps to slow or stop the spread of cancer cells. It may also be used to treat autoimmune conditions, such as arthritis. It works by slowing down an overactive immune system. It is a monoclonal antibody. This medicine may be used for other purposes; ask your health care provider or pharmacist if you have questions. COMMON BRAND NAME(S): RIABNI, Rituxan, RUXIENCE,  truxima What should I tell my care team before I take this medication? They need to know if you have any of these conditions: Chest pain Heart disease Immune system problems Infection, such as chickenpox, cold sores, hepatitis B, herpes Irregular heartbeat or rhythm Kidney disease Low blood counts, such as low white cells, platelets, red  cells Lung disease Recent or upcoming vaccine An unusual or allergic reaction to rituximab, other medications, foods, dyes, or preservatives Pregnant or trying to get pregnant Breast-feeding How should I use this medication? This medication is injected into a vein. It is given by a care team in a hospital or clinic setting. A special MedGuide will be given to you before each treatment. Be sure to read this information carefully each time. Talk to your care team about the use of this medication in children. While this medication may be prescribed for children as young as 6 months for selected conditions, precautions do apply. Overdosage: If you think you have taken too much of this medicine contact a poison control center or emergency room at once. NOTE: This medicine is only for you. Do not share this medicine with others. What if I miss a dose? Keep appointments for follow-up doses. It is important not to miss your dose. Call your care team if you are unable to keep an appointment. What may interact with this medication? Do not take this medication with any of the following: Live vaccines This medication may also interact with the following: Cisplatin This list may not describe all possible interactions. Give your health care provider a list of all the medicines, herbs, non-prescription drugs, or dietary supplements you use. Also tell them if you smoke, drink alcohol, or use illegal drugs. Some items may interact with your medicine. What should I watch for while using this medication? Your condition will be monitored carefully while you are receiving this medication. You may need blood work while taking this medication. This medication can cause serious infusion reactions. To reduce the risk your care team may give you other medications to take before receiving this one. Be sure to follow the directions from your care team. This medication may increase your risk of getting an infection. Call  your care team for advice if you get a fever, chills, sore throat, or other symptoms of a cold or flu. Do not treat yourself. Try to avoid being around people who are sick. Call your care team if you are around anyone with measles, chickenpox, or if you develop sores or blisters that do not heal properly. Avoid taking medications that contain aspirin, acetaminophen , ibuprofen, naproxen, or ketoprofen unless instructed by your care team. These medications may hide a fever. This medication may cause serious skin reactions. They can happen weeks to months after starting the medication. Contact your care team right away if you notice fevers or flu-like symptoms with a rash. The rash may be red or purple and then turn into blisters or peeling of the skin. You may also notice a red rash with swelling of the face, lips, or lymph nodes in your neck or under your arms. In some patients, this medication may cause a serious brain infection that may cause death. If you have any problems seeing, thinking, speaking, walking, or standing, tell your care team right away. If you cannot reach your care team, urgently seek another source of medical care. Talk to your care team if you may be pregnant. Serious birth defects can occur if you take this medication during  pregnancy and for 12 months after the last dose. You will need a negative pregnancy test before starting this medication. Contraception is recommended while taking this medication and for 12 months after the last dose. Your care team can help you find the option that works for you. Do not breastfeed while taking this medication and for at least 6 months after the last dose. What side effects may I notice from receiving this medication? Side effects that you should report to your care team as soon as possible: Allergic reactions or angioedema--skin rash, itching or hives, swelling of the face, eyes, lips, tongue, arms, or legs, trouble swallowing or  breathing Bowel blockage--stomach cramping, unable to have a bowel movement or pass gas, loss of appetite, vomiting Dizziness, loss of balance or coordination, confusion or trouble speaking Heart attack--pain or tightness in the chest, shoulders, arms, or jaw, nausea, shortness of breath, cold or clammy skin, feeling faint or lightheaded Heart rhythm changes--fast or irregular heartbeat, dizziness, feeling faint or lightheaded, chest pain, trouble breathing Infection--fever, chills, cough, sore throat, wounds that don't heal, pain or trouble when passing urine, general feeling of discomfort or being unwell Infusion reactions--chest pain, shortness of breath or trouble breathing, feeling faint or lightheaded Kidney injury--decrease in the amount of urine, swelling of the ankles, hands, or feet Liver injury--right upper belly pain, loss of appetite, nausea, light-colored stool, dark yellow or brown urine, yellowing skin or eyes, unusual weakness or fatigue Redness, blistering, peeling, or loosening of the skin, including inside the mouth Stomach pain that is severe, does not go away, or gets worse Tumor lysis syndrome (TLS)--nausea, vomiting, diarrhea, decrease in the amount of urine, dark urine, unusual weakness or fatigue, confusion, muscle pain or cramps, fast or irregular heartbeat, joint pain Side effects that usually do not require medical attention (report to your care team if they continue or are bothersome): Headache Joint pain Nausea Runny or stuffy nose Unusual weakness or fatigue This list may not describe all possible side effects. Call your doctor for medical advice about side effects. You may report side effects to FDA at 1-800-FDA-1088. Where should I keep my medication? This medication is given in a hospital or clinic. It will not be stored at home. NOTE: This sheet is a summary. It may not cover all possible information. If you have questions about this medicine, talk to your  doctor, pharmacist, or health care provider.  2024 Elsevier/Gold Standard (2022-03-29 00:00:00)  Bendamustine Injection What is this medication? BENDAMUSTINE (BEN da MUS teen) treats leukemia and lymphoma. It works by slowing down the growth of cancer cells. This medicine may be used for other purposes; ask your health care provider or pharmacist if you have questions. COMMON BRAND NAME(S): BELRAPZO, BENDEKA, Treanda, VIVIMUSTA What should I tell my care team before I take this medication? They need to know if you have any of these conditions: Infection, especially a viral infection, such as chickenpox, cold sores, herpes Kidney disease Liver disease An unusual or allergic reaction to bendamustine, mannitol, other medications, foods, dyes, or preservatives Pregnant or trying to get pregnant Breast-feeding How should I use this medication? This medication is injected into a vein. It is given by your care team in a hospital or clinic setting. Talk to your care team about the use of this medication in children. Special care may be needed. Overdosage: If you think you have taken too much of this medicine contact a poison control center or emergency room at once. NOTE: This medicine is  only for you. Do not share this medicine with others. What if I miss a dose? Keep appointments for follow-up doses. It is important not to miss your dose. Call your care team if you are unable to keep an appointment. What may interact with this medication? Do not take this medication with any of the following: Clozapine This medication may also interact with the following: Atazanavir Cimetidine Ciprofloxacin Enoxacin Fluvoxamine Medications for seizures, such as carbamazepine, phenobarbital Mexiletine Rifampin Tacrine Thiabendazole Zileuton This list may not describe all possible interactions. Give your health care provider a list of all the medicines, herbs, non-prescription drugs, or dietary  supplements you use. Also tell them if you smoke, drink alcohol, or use illegal drugs. Some items may interact with your medicine. What should I watch for while using this medication? Visit your care team for regular checks on your progress. This medication may make you feel generally unwell. This is not uncommon, as chemotherapy can affect healthy cells as well as cancer cells. Report any side effects. Continue your course of treatment even though you feel ill unless your care team tells you to stop. You may need blood work while taking this medication. This medication may increase your risk of getting an infection. Call your care team for advice if you get a fever, chills, sore throat, or other symptoms of a cold or flu. Do not treat yourself. Try to avoid being around people who are sick. This medication may cause serious skin reactions. They can happen weeks to months after starting the medication. Contact your care team right away if you notice fevers or flu-like symptoms with a rash. The rash may be red or purple and then turn into blisters or peeling of the skin. You may also notice a red rash with swelling of the face, lips, or lymph nodes in your neck or under your arms. In some patients, this medication may cause a serious brain infection that may cause death. If you have any problems seeing, thinking, speaking, walking, or standing, tell your care team right away. If you cannot reach your care team, urgently seek other source of medical care. This medication may increase your risk to bruise or bleed. Call your care team if you notice any unusual bleeding. Talk to your care team about your risk of cancer. You may be more at risk for certain types of cancer if you take this medication. Talk to your care team about your risk of skin cancer. You may be more at risk for skin cancer if you take this medication. Talk to your care team if you or your partner wish to become pregnant or think either of  you might be pregnant. This medication can cause serious birth defects if taken during pregnancy or for up to 6 months after the last dose. A negative pregnancy test is required before starting this medication. A reliable form of contraception is recommended while taking this medication and for 6 months after the last dose. Talk to your care team about reliable forms of contraception. Wear a condom while taking this medication and for at least 3 months after the last dose. Do not breast-feed while taking this medication or for at least 1 week after the last dose. This medication may cause infertility. Talk to your care team if you are concerned about your fertility. What side effects may I notice from receiving this medication? Side effects that you should report to your care team as soon as possible: Allergic reactions--skin rash, itching,  hives, swelling of the face, lips, tongue, or throat Infection--fever, chills, cough, sore throat, wounds that don't heal, pain or trouble when passing urine, general feeling of discomfort or being unwell Infusion reactions--chest pain, shortness of breath or trouble breathing, feeling faint or lightheaded Liver injury--right upper belly pain, loss of appetite, nausea, light-colored stool, dark yellow or brown urine, yellowing skin or eyes, unusual weakness or fatigue Low red blood cell level--unusual weakness or fatigue, dizziness, headache, trouble breathing Painful swelling, warmth, or redness of the skin, blisters or sores at the infusion site Rash, fever, and swollen lymph nodes Redness, blistering, peeling, or loosening of the skin, including inside the mouth Tumor lysis syndrome (TLS)--nausea, vomiting, diarrhea, decrease in the amount of urine, dark urine, unusual weakness or fatigue, confusion, muscle pain or cramps, fast or irregular heartbeat, joint pain Unusual bruising or bleeding Side effects that usually do not require medical attention (report to  your care team if they continue or are bothersome): Diarrhea Fatigue Headache Loss of appetite Nausea Vomiting This list may not describe all possible side effects. Call your doctor for medical advice about side effects. You may report side effects to FDA at 1-800-FDA-1088. Where should I keep my medication? This medication is given in a hospital or clinic. It will not be stored at home. NOTE: This sheet is a summary. It may not cover all possible information. If you have questions about this medicine, talk to your doctor, pharmacist, or health care provider.  2024 Elsevier/Gold Standard (2022-02-27 00:00:00)

## 2024-10-19 NOTE — Progress Notes (Signed)
 Hepatitis B core antibody not collected prior to arrival in infusion. Dr. Onesimo gave okay to proceed with rituximab and ordered hepatitis B core antibody to be collected afterwards.  Harlene Nasuti, PharmD Oncology Infusion Pharmacist 10/19/2024 3:27 PM

## 2024-10-20 ENCOUNTER — Inpatient Hospital Stay

## 2024-10-20 VITALS — BP 98/73 | HR 73 | Temp 97.6°F | Resp 18

## 2024-10-20 DIAGNOSIS — Z5112 Encounter for antineoplastic immunotherapy: Secondary | ICD-10-CM | POA: Diagnosis not present

## 2024-10-20 DIAGNOSIS — C8312 Mantle cell lymphoma, intrathoracic lymph nodes: Secondary | ICD-10-CM

## 2024-10-20 LAB — HEPATITIS B CORE ANTIBODY, TOTAL: HEP B CORE AB: NEGATIVE

## 2024-10-20 MED ORDER — SODIUM CHLORIDE 0.9 % IV SOLN
90.0000 mg/m2 | Freq: Once | INTRAVENOUS | Status: AC
  Administered 2024-10-20: 170 mg via INTRAVENOUS
  Filled 2024-10-20: qty 6.8

## 2024-10-20 MED ORDER — DEXAMETHASONE SOD PHOSPHATE PF 10 MG/ML IJ SOLN
10.0000 mg | Freq: Once | INTRAMUSCULAR | Status: AC
  Administered 2024-10-20: 10 mg via INTRAVENOUS

## 2024-10-20 MED ORDER — SODIUM CHLORIDE 0.9 % IV SOLN: INTRAVENOUS | Status: DC

## 2024-10-20 NOTE — Patient Instructions (Signed)
 [facility] - A DEPT OF [facility]  Discharge Instructions: Thank you for choosing [facility] to provide your oncology and hematology care.   If you have a lab appointment with the Cancer Center, please go directly to the Cancer Center and check in at the registration area.   Wear comfortable clothing and clothing appropriate for easy access to any Portacath or PICC line.   We strive to give you quality time with your provider. You may need to reschedule your appointment if you arrive late (15 or more minutes).  Arriving late affects you and other patients whose appointments are after yours.  Also, if you miss three or more appointments without notifying the office, you may be dismissed from the clinic at the provider's discretion.      For prescription refill requests, have your pharmacy contact our office and allow 72 hours for refills to be completed.    Today you received the following chemotherapy and/or immunotherapy agents BENDAMUSTINE   To help prevent nausea and vomiting after your treatment, we encourage you to take your nausea medication as directed.  BELOW ARE SYMPTOMS THAT SHOULD BE REPORTED IMMEDIATELY: *FEVER GREATER THAN 100.4 F (38 C) OR HIGHER *CHILLS OR SWEATING *NAUSEA AND VOMITING THAT IS NOT CONTROLLED WITH YOUR NAUSEA MEDICATION *UNUSUAL SHORTNESS OF BREATH *UNUSUAL BRUISING OR BLEEDING *URINARY PROBLEMS (pain or burning when urinating, or frequent urination) *BOWEL PROBLEMS (unusual diarrhea, constipation, pain near the anus) TENDERNESS IN MOUTH AND THROAT WITH OR WITHOUT PRESENCE OF ULCERS (sore throat, sores in mouth, or a toothache) UNUSUAL RASH, SWELLING OR PAIN  UNUSUAL VAGINAL DISCHARGE OR ITCHING   Items with * indicate a potential emergency and should be followed up as soon as possible or go to the Emergency Department if any problems should occur.  Please show the CHEMOTHERAPY ALERT CARD or IMMUNOTHERAPY  ALERT CARD at check-in to the Emergency Department and triage nurse.  Should you have questions after your visit or need to cancel or reschedule your appointment, please contact [facility] - A DEPT OF [facility]  Dept: [phone]  and follow the prompts.  Office hours are 8:00 a.m. to 4:30 p.m. Monday - Friday. Please note that voicemails left after 4:00 p.m. may not be returned until the following business day.  We are closed weekends and major holidays. You have access to a nurse at all times for urgent questions. Please call the main number to the clinic Dept: [phone] and follow the prompts.   For any non-urgent questions, you may also contact your provider using MyChart. We now offer e-Visits for anyone 1 and older to request care online for non-urgent symptoms. For details visit [email or web address].   Also download the MyChart app! Go to the app store, search MyChart, open the app, select [place], and log in with your MyChart username and password.  Bendamustine Injection What is this medication? BENDAMUSTINE (BEN da MUS teen) treats leukemia and lymphoma. It works by slowing down the growth of cancer cells. This medicine may be used for other purposes; ask your health care provider or pharmacist if you have questions. COMMON BRAND NAME(S): BELRAPZO, BENDEKA, Treanda, VIVIMUSTA What should I tell my care team before I take this medication? They need to know if you have any of these conditions: Infection, especially a viral infection, such as chickenpox, cold sores, herpes Kidney disease Liver disease An unusual or allergic reaction to  bendamustine, mannitol, other medications, foods, dyes, or preservatives Pregnant or trying to get pregnant Breast-feeding How should I use this medication? This medication is injected into a vein. It is given by your care team in a hospital or clinic setting. Talk to your care team about the use of this  medication in children. Special care may be needed. Overdosage: If you think you have taken too much of this medicine contact a poison control center or emergency room at once. NOTE: This medicine is only for you. Do not share this medicine with others. What if I miss a dose? Keep appointments for follow-up doses. It is important not to miss your dose. Call your care team if you are unable to keep an appointment. What may interact with this medication? Do not take this medication with any of the following: Clozapine This medication may also interact with the following: Atazanavir Cimetidine Ciprofloxacin Enoxacin Fluvoxamine Medications for seizures, such as carbamazepine, phenobarbital Mexiletine Rifampin Tacrine Thiabendazole Zileuton This list may not describe all possible interactions. Give your health care provider a list of all the medicines, herbs, non-prescription drugs, or dietary supplements you use. Also tell them if you smoke, drink alcohol, or use illegal drugs. Some items may interact with your medicine. What should I watch for while using this medication? Visit your care team for regular checks on your progress. This medication may make you feel generally unwell. This is not uncommon, as chemotherapy can affect healthy cells as well as cancer cells. Report any side effects. Continue your course of treatment even though you feel ill unless your care team tells you to stop. You may need blood work while taking this medication. This medication may increase your risk of getting an infection. Call your care team for advice if you get a fever, chills, sore throat, or other symptoms of a cold or flu. Do not treat yourself. Try to avoid being around people who are sick. This medication may cause serious skin reactions. They can happen weeks to months after starting the medication. Contact your care team right away if you notice fevers or flu-like symptoms with a rash. The rash may be  red or purple and then turn into blisters or peeling of the skin. You may also notice a red rash with swelling of the face, lips, or lymph nodes in your neck or under your arms. In some patients, this medication may cause a serious brain infection that may cause death. If you have any problems seeing, thinking, speaking, walking, or standing, tell your care team right away. If you cannot reach your care team, urgently seek other source of medical care. This medication may increase your risk to bruise or bleed. Call your care team if you notice any unusual bleeding. Talk to your care team about your risk of cancer. You may be more at risk for certain types of cancer if you take this medication. Talk to your care team about your risk of skin cancer. You may be more at risk for skin cancer if you take this medication. Talk to your care team if you or your partner wish to become pregnant or think either of you might be pregnant. This medication can cause serious birth defects if taken during pregnancy or for up to 6 months after the last dose. A negative pregnancy test is required before starting this medication. A reliable form of contraception is recommended while taking this medication and for 6 months after the last dose. Talk to your care  team about reliable forms of contraception. Wear a condom while taking this medication and for at least 3 months after the last dose. Do not breast-feed while taking this medication or for at least 1 week after the last dose. This medication may cause infertility. Talk to your care team if you are concerned about your fertility. What side effects may I notice from receiving this medication? Side effects that you should report to your care team as soon as possible: Allergic reactions--skin rash, itching, hives, swelling of the face, lips, tongue, or throat Infection--fever, chills, cough, sore throat, wounds that don't heal, pain or trouble when passing urine, general  feeling of discomfort or being unwell Infusion reactions--chest pain, shortness of breath or trouble breathing, feeling faint or lightheaded Liver injury--right upper belly pain, loss of appetite, nausea, light-colored stool, dark yellow or brown urine, yellowing skin or eyes, unusual weakness or fatigue Low red blood cell level--unusual weakness or fatigue, dizziness, headache, trouble breathing Painful swelling, warmth, or redness of the skin, blisters or sores at the infusion site Rash, fever, and swollen lymph nodes Redness, blistering, peeling, or loosening of the skin, including inside the mouth Tumor lysis syndrome (TLS)--nausea, vomiting, diarrhea, decrease in the amount of urine, dark urine, unusual weakness or fatigue, confusion, muscle pain or cramps, fast or irregular heartbeat, joint pain Unusual bruising or bleeding Side effects that usually do not require medical attention (report to your care team if they continue or are bothersome): Diarrhea Fatigue Headache Loss of appetite Nausea Vomiting This list may not describe all possible side effects. Call your doctor for medical advice about side effects. You may report side effects to FDA at 1-800-FDA-1088. Where should I keep my medication? This medication is given in a hospital or clinic. It will not be stored at home. NOTE: This sheet is a summary. It may not cover all possible information. If you have questions about this medicine, talk to your doctor, pharmacist, or health care provider.  2024 Elsevier/Gold Standard (2022-02-27 00:00:00)

## 2024-10-21 ENCOUNTER — Telehealth: Payer: Self-pay

## 2024-10-21 NOTE — Telephone Encounter (Signed)
LM for patient that this nurse was calling to see how they were doing after their treatment. Please call back to Dr. Kale's nurse at [phone] if they have any questions or concerns regarding the treatment. 

## 2024-10-28 ENCOUNTER — Other Ambulatory Visit: Payer: Self-pay

## 2024-10-28 DIAGNOSIS — I482 Chronic atrial fibrillation, unspecified: Secondary | ICD-10-CM

## 2024-10-28 MED ORDER — APIXABAN 5 MG PO TABS: 5.0000 mg | ORAL_TABLET | Freq: Two times a day (BID) | ORAL | 1 refills | Status: AC

## 2024-10-28 NOTE — Telephone Encounter (Signed)
 Faxed refill request for Eliquis  received from [facility] in [place]. Pt last saw Josefa Beauvais, NP on 01/06/24, last labs 10/19/24 Creat 0.83, age 78, weight 70.2kg, based on specified criteria pt is on appropriate dosage of Eliquis  5mg  BID for afib.  Will refill rx.

## 2024-10-30 ENCOUNTER — Other Ambulatory Visit: Payer: Self-pay | Admitting: Hematology

## 2024-10-30 ENCOUNTER — Encounter: Payer: Self-pay | Admitting: Cardiology

## 2024-10-30 DIAGNOSIS — C8312 Mantle cell lymphoma, intrathoracic lymph nodes: Secondary | ICD-10-CM

## 2024-11-03 ENCOUNTER — Telehealth: Payer: Self-pay | Admitting: Cardiology

## 2024-11-03 NOTE — Telephone Encounter (Signed)
 Patient c/o Palpitations: STAT if patient c/o lightheadedness, shortness of breath, or chest pain  How long have you had palpitations/irregular HR/ Afib? Are you having the symptoms now?  Afib started last week   Are you currently experiencing lightheadedness, SOB or CP? No   Do you have a history of afib (atrial fibrillation) or irregular heart rhythm? BP 98/73 hr 73 - 12/2 during oncology infusion appt    Have you checked your BP or HR? (document readings if available):  no   Are you experiencing any other symptoms? No

## 2024-11-03 NOTE — Telephone Encounter (Signed)
 Had infusion at the first of the month. This will be the 2nd of 6. Receiving Rituximab .   Just felt for the first time- around 12/9 or 12/10- reports feeling afib- flutters in chest; no dizziness, no shortness of breath. This just happened for a day or two. He no longer feels like he is in afib. [place] that in the past he could never tell he was in afib)  Today- BP 115/72 hr 103, then 94/67 hr 96    He has an appt on Thursday with Oncology.   Just wondering if he needs an earlier appt to be seen; if he maybe needs some prn meds to take for break-through afib? Is this to be expected with treatment?  Informed him that I would give this information to Dr Shlomo, and we will call him back with recommendations. He verbalized understanding.

## 2024-11-03 NOTE — Telephone Encounter (Signed)
 Pt notified of MD response & recommendation. Aware someone will contact him to arrange afib clinic appt.  Pt asking if can have in the next couple weeks as he is scheduled to have his second infusion in a little less than 2 weeks.  Aware with the holidays this may be difficult but will see what their office can accommodate. Pt agreeable to plan.

## 2024-11-05 ENCOUNTER — Other Ambulatory Visit: Payer: Self-pay

## 2024-11-05 ENCOUNTER — Inpatient Hospital Stay: Admitting: Hematology

## 2024-11-05 ENCOUNTER — Inpatient Hospital Stay

## 2024-11-05 VITALS — BP 98/66 | HR 96 | Temp 97.3°F | Wt 147.0 lb

## 2024-11-05 DIAGNOSIS — C8312 Mantle cell lymphoma, intrathoracic lymph nodes: Secondary | ICD-10-CM

## 2024-11-05 DIAGNOSIS — Z5112 Encounter for antineoplastic immunotherapy: Secondary | ICD-10-CM | POA: Diagnosis not present

## 2024-11-05 LAB — CMP (CANCER CENTER ONLY)
ALT: 18 U/L (ref 0–44)
AST: 25 U/L (ref 15–41)
Albumin: 3.9 g/dL (ref 3.5–5.0)
Alkaline Phosphatase: 138 U/L — ABNORMAL HIGH (ref 38–126)
Anion gap: 8 (ref 5–15)
BUN: 16 mg/dL (ref 8–23)
CO2: 27 mmol/L (ref 22–32)
Calcium: 9.1 mg/dL (ref 8.9–10.3)
Chloride: 102 mmol/L (ref 98–111)
Creatinine: 0.81 mg/dL (ref 0.61–1.24)
GFR, Estimated: >60 mL/min (ref >60)
Glucose, Bld: 123 mg/dL — ABNORMAL HIGH (ref 70–99)
Potassium: 4.2 mmol/L (ref 3.5–5.1)
Sodium: 137 mmol/L (ref 135–145)
Total Bilirubin: 0.6 mg/dL (ref 0.0–1.2)
Total Protein: 6.8 g/dL (ref 6.5–8.1)

## 2024-11-05 LAB — CBC WITH DIFFERENTIAL (CANCER CENTER ONLY)
Abs Immature Granulocytes: 0.02 K/uL (ref 0.00–0.07)
Basophils Absolute: 0 K/uL (ref 0.0–0.1)
Basophils Relative: 1 %
Eosinophils Absolute: 0.2 K/uL (ref 0.0–0.5)
Eosinophils Relative: 4 %
HCT: 39.4 % (ref 39.0–52.0)
Hemoglobin: 13.1 g/dL (ref 13.0–17.0)
Immature Granulocytes: 1 %
Lymphocytes Relative: 14 %
Lymphs Abs: 0.6 K/uL — ABNORMAL LOW (ref 0.7–4.0)
MCH: 29.2 pg (ref 26.0–34.0)
MCHC: 33.2 g/dL (ref 30.0–36.0)
MCV: 87.9 fL (ref 80.0–100.0)
Monocytes Absolute: 0.7 K/uL (ref 0.1–1.0)
Monocytes Relative: 18 %
Neutro Abs: 2.5 K/uL (ref 1.7–7.7)
Neutrophils Relative %: 62 %
Platelet Count: 100 K/uL — ABNORMAL LOW (ref 150–400)
RBC: 4.48 MIL/uL (ref 4.22–5.81)
RDW: 14.7 % (ref 11.5–15.5)
WBC Count: 4 K/uL (ref 4.0–10.5)
nRBC: 0 % (ref 0.0–0.2)

## 2024-11-05 NOTE — Progress Notes (Signed)
 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    HISTORY OF PRESENTING ILLNESS:  (09/29/2024) Scott Newton is a wonderful 78 y.o. male who has been referred to us  by Dr. Potts Odean, MD for evaluation and management of his newly mantle cell non-hodgkin's lymphoma.    He initially noticed some significant bruising and significant weight loss. He went from 176 to 159 lbs. He reports that his appetite has been suppressed over the course of the past few months. He has noticed he is affected by the cold more in the past few months. He began taking 1000mcg of B12 vitamin in response to his decreased B12 levels in his last labs. He denies unexplained fevers, night sweats, sudden changes to fatigue, difficulty swallowing, or a change in bowel habits. He experienced nausea for a short time in the past few months, but this has improved.   He was initially seen by Dr. Gudena in August 2025 for thrombocytopenia and evaluated for this. Patient subsequently had gross hematuria and was evaluated by urology and had a CT of the abdomen done on 08/05/2024 with his urologist that showed splenomegaly and significant lymphadenopathy in the abdomen.   Patient subsequently had a CT of the chest and neck with Dr. Odean in October 2025 which showed a lymph node at the left base of the neck, multiple enlarged lymph nodes mediastinal, bilateral hilar and axillary.  Multiple enlarged upper abdominal lymph nodes and an enlarged spleen.  Pleural-based 4 x 6 mm opacity in the right middle lobe of the lung.   Patient subsequently had a  lymph node biopsy on 09/09/2024 office left axillary lymph node which showed a CD5 positive mature B-cell lymphoma consistent with mantle cell lymphoma. Bone marrow biopsy done on 09/24/2024 showed hypercellular marrow with 10% involvement with CD5 positive B-cell lymphoma with [facility] results showing translocation T (11;4 ) positive consistent with mantle cell lymphoma.     Mr. Haigler has been on Eliquis  for about 10 years for his A Fib. He also has been diagnosed with BPH. He is taking flomax regularly for this.   He had a rt total hip arthroplasty in March. He had a PET scan, in which some lumps were discovered in the abdominal region.    Mr. 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 He denies any exposure to hazardous chemicals. He used to travel often for work. He was a former smoker but quit 20 years ago. He has 2-3 glasses of wine a few times a week.   He had two brothers die of cancer, one from prostate cancer.    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 male who is here today for continued evaluation and management of Mantle Cell Non-hodgkin's Lymphoma and toxicity check status post Rituximab  + Bendamustine  infusion.   he was last seen by me on 09/29/2024.  Today, he says that he never needed any of the anti-nausea or vomiting. He says that he never felt nauseous, but did feel off afterwards. He has been able to maintain diet and hydration for the most part, noting a couple of days of constipation affecting his  hydration. He notes some rhinorrhea, but this is to be somewhat expected during Winter.  Denies any abdominal pain, new lumps/bumps, testicular pain/swelling, or SOB.  He reports having had some leg pain, for which he has been taking Tylenol  for and a Barista, which improved this pain. He notes that he's always had pain due to plantar fasciitis, and he does have a fhx of gout in his brother (diet related). Pain is not limiting his activity.   He says that his urine still has a smoky bacon malodor to it. Denies dysuria. Does admit to having some difficulty emptying his bladder, for which he takes Tamsulosin to manage, but does still have some nocturia.   He reports that he hasn't lost anymore weight, is currently stable at 147 LBS, but feels that his most appropriate weight is around the 160s.  He states that he checked his blood pressure recently due to concern for an afib episode. He does take Eliquis  5 mg twice daily and Metoprolol  succinate 75 mg in the morning and 25 mg in the evening.   REVIEW OF SYSTEMS:   10 Point review of systems of done and is negative except as noted above.  MEDICAL HISTORY Past Medical History:  Diagnosis Date   Anticoagulant long-term use    eliquis    BPH with obstruction/lower urinary tract symptoms    Chronic atrial fibrillation [facility]) cardiologist-  dr wilbert turner   dx 10/ 2016---  s/p DCCV with reversion back to afib now pursuing rate control. 10-19-2015   Dysrhythmia 2015   Afib   ED (erectile dysfunction)    Essential hypertension    Full dentures    Gross hematuria    Peyronie's disease    Umbilical hernia    Weak urinary stream    Wears glasses     SURGICAL HISTORY Past Surgical History:  Procedure Laterality Date   CARDIOVERSION N/A 10/19/2015   Procedure: CARDIOVERSION;  Surgeon: Maude JAYSON Emmer, MD;  Location: [facility] ENDOSCOPY;  Service: Cardiovascular;  Laterality: N/A;   CYSTOSCOPY WITH FULGERATION N/A 03/18/2018   Procedure:  CYSTOSCOPY WITH FULGERATION/ BLADDER BIOPSY;  Surgeon: Nieves Cough, MD;  Location: [facility];  Service: Urology;  Laterality: N/A;   CYSTOSCOPY WITH FULGERATION N/A 09/15/2019   Procedure: CYSTOSCOPY WITH FULGERATION/BLADDER BIOPSY/ BILATERAL RETROGRADE;  Surgeon: Nieves Cough, MD;  Location: [facility];  Service: Urology;  Laterality: N/A;   CYSTOSCOPY WITH INSERTION OF UROLIFT N/A 03/18/2018   Procedure: CYSTOSCOPY WITH INSERTION OF UROLIFT;  Surgeon: Nieves Cough, MD;  Location: [facility];  Service: Urology;  Laterality: N/A;   IR US  LIVER BIOPSY  09/09/2024   KNEE ARTHROSCOPY Left 2009  approx.   TONSILLECTOMY  child   TRANSTHORACIC ECHOCARDIOGRAM  09-27-2015  dr wilbert turner   mild focal basal hypertrophy of the septum,  ef 60-65%/  trivial AR/  mild MR/  severe LAE/      SOCIAL HISTORY Social History[1]  Social History   Social History Narrative  Not on file    SOCIAL DRIVERS OF HEALTH SDOH Screenings   Food Insecurity: Patient Declined (09/26/2024)  Housing: Patient Declined (09/26/2024)  Transportation Needs: Patient Declined (09/26/2024)  Utilities: Patient Declined (09/26/2024)  Depression (PHQ2-9): Low Risk (07/01/2024)  Tobacco Use: High Risk (10/11/2024)     FAMILY HISTORY Family History  Problem Relation Age of Onset   Heart disease Father 53   Heart attack Father 42   Hypertension Mother 76   Heart attack Brother 6   Stroke Neg Hx      ALLERGIES: has no known allergies.  MEDICATIONS  Current Outpatient Medications  Medication Sig Dispense Refill   acetaminophen  (TYLENOL ) 500 MG tablet Take 500 mg by mouth every 6 (six) hours as needed for moderate pain or headache.     acyclovir  (ZOVIRAX ) 400 MG tablet Take 1 tablet (400 mg total) by mouth 2 (two) times daily. 60 tablet 6   allopurinol  (ZYLOPRIM ) 100 MG tablet TAKE 1 TABLET BY MOUTH TWICE A DAY 180 tablet 1   apixaban  (ELIQUIS ) 5 MG TABS tablet Take 1 tablet  (5 mg total) by mouth 2 (two) times daily. 180 tablet 1   cephALEXin  (KEFLEX ) 500 MG capsule Take 1 capsule (500 mg total) by mouth 4 (four) times daily. 28 capsule 0   dexamethasone  (DECADRON ) 4 MG tablet Take 2 tablets (8 mg total) by mouth daily. Start the day after bendamustine  chemotherapy for 2 days. Take with food. 30 tablet 1   finasteride (PROSCAR) 5 MG tablet Take 5 mg by mouth daily.     Magnesium 250 MG TABS Take 250 mg by mouth daily at 6 (six) AM.     metoprolol  succinate (TOPROL -XL) 50 MG 24 hr tablet TAKE 1.5 TABLETS EVERY MORNING AND 0.5 TAB EVERY EVENING TAKE WITH OR IMMEDIATELY FOLLOWING A MEAL. 180 tablet 0   Multiple Vitamins-Minerals (MULTIVITAMIN WITH MINERALS) tablet Take 1 tablet by mouth 2 (two) times a week.      ondansetron  (ZOFRAN ) 8 MG tablet Take 1 tablet (8 mg total) by mouth every 8 (eight) hours as needed for nausea or vomiting. Start on the third day after chemotherapy. 30 tablet 1   prochlorperazine  (COMPAZINE ) 10 MG tablet Take 1 tablet (10 mg total) by mouth every 6 (six) hours as needed for nausea or vomiting. 30 tablet 1   sildenafil (REVATIO) 20 MG tablet Take 20-100 mg by mouth daily as needed for erectile dysfunction.     tamsulosin (FLOMAX) 0.4 MG CAPS capsule Take 0.4 mg by mouth daily.     No current facility-administered medications for this visit.    PHYSICAL EXAMINATION: ECOG PERFORMANCE STATUS: 1 - Symptomatic but completely ambulatory VITALS: Vitals:   11/05/24 0914  BP: 98/66  Pulse: 96  Temp: (!) 97.3 F (36.3 C)  SpO2: 100%   Filed Weights   11/05/24 0914  Weight: 147 lb (66.7 kg)   Body mass index is 21.71 kg/m.  GENERAL: alert, in no acute distress and comfortable SKIN: no acute rashes, no significant lesions EYES: conjunctiva are pink and non-injected, sclera anicteric OROPHARYNX: MMM, no exudates, no oropharyngeal erythema or ulceration NECK: supple, no JVD LYMPH:  no palpable lymphadenopathy in the cervical, axillary or  inguinal regions LUNGS: clear to auscultation b/l with normal respiratory effort HEART: regular rate & rhythm ABDOMEN:  normoactive bowel sounds , non tender, not distended, no hepatosplenomegaly Extremity: no pedal edema PSYCH: alert & oriented x 3 with fluent speech NEURO: no focal motor/sensory deficits  LABORATORY DATA:  I have reviewed the data as listed     Latest Ref Rng & Units 11/05/2024    8:26 AM 10/19/2024    1:47 PM 10/12/2024    4:10 PM  CBC EXTENDED  WBC 4.0 - 10.5 K/uL 4.0  6.5  5.8   RBC 4.22 - 5.81 MIL/uL 4.48  4.44  5.02   Hemoglobin 13.0 - 17.0 g/dL 86.8  86.9  85.5   HCT 39.0 - 52.0 % 39.4  38.9  43.4   Platelets 150 - 400 K/uL 100  188  236   NEUT# 1.7 - 7.7 K/uL 2.5  5.9  3.8   Lymph# 0.7 - 4.0 K/uL 0.6  0.4  1.1       Latest Ref Rng & Units 11/05/2024    8:26 AM 10/19/2024    1:47 PM 10/12/2024    4:10 PM  CMP  Glucose 70 - 99 mg/dL 876  853  893   BUN 8 - 23 mg/dL 16  12  19    Creatinine 0.61 - 1.24 mg/dL 9.18  9.16  9.16   Sodium 135 - 145 mmol/L 137  137  136   Potassium 3.5 - 5.1 mmol/L 4.2  4.7  4.2   Chloride 98 - 111 mmol/L 102  105  99   CO2 22 - 32 mmol/L 27  25  27    Calcium 8.9 - 10.3 mg/dL 9.1  8.7  9.1   Total Protein 6.5 - 8.1 g/dL 6.8  6.9  7.7   Total Bilirubin 0.0 - 1.2 mg/dL 0.6  0.3  0.6   Alkaline Phos 38 - 126 U/L 138  141  170   AST 15 - 41 U/L 25  41  30   ALT 0 - 44 U/L 18  20  20     10/08/2024   10/02/2024     07/01/2024 09/16/2024  Vitamin B12     180 - 914 pg/mL 192     Immature Platelet Fraction     1.2 - 8.6 % 2.5     HIV Screen 4th Generation wRfx     Non Reactive    Non Reactive   HCV Ab     NON REACTIVE    NON REACTIVE   Hepatitis B Surface Ag     NON REACTIVE    NON REACTIVE   LDH     98 - 192 U/L   231 (H)     09/29/2024 Surgical Pathology CASE: [id number] PATIENT: Domingo Mccamy Bone Marrow Report  Clinical History: lymphadenopathy, lymphoma work up and staging   DIAGNOSIS:  BONE  MARROW, ASPIRATE, CLOT, CORE: - Hypercellular bone marrow (60%) involved by the patient's known CD5 positive B- cell lymphoma (approximately 10% cellularity).  See comment.  PERIPHERAL BLOOD: - Thrombocytopenia  COMMENT: The patient's history of a CD5 positive mature B-cell lymphoma with low-grade B-cell lymphoma FISH panel performed on the lymph node at [facility] positive for t(11,14), consistent with mantle cell lymphoma, is noted.  Findings on the bone marrow specimen are similar and reveal involvement by the patient's lymphoma to approximately 10% of the cellularity.  Additionally, flow cytometric analysis revealed a similar abnormal clonal B-cell population comprising 13% of lymphocytes.  The cells are positive for CD5, CD19, CD20, CD200, HLA-DR and express lambda light chains.  Correlation with pending cytogenetics is recommended for further assessment.  MICROSCOPIC DESCRIPTION:  PERIPHERAL BLOOD SMEAR: Platelets: Decreased, no platelet clumps identified Erythroid: Adequate Leukocytes: Adequate, negative for dysplastic granulocytes or blasts  BONE MARROW ASPIRATE: Cellular Erythroid precursors: Erythroid precursors show a full sequence of maturation Granulocytic precursors: Granulocytic precursors show a full sequence of maturation Megakaryocytes: Typical in number and morphology with occasional hypolobated forms Lymphocytes/plasma cells: Increased lymphocytes  TOUCH PREPARATIONS: Cellular, confirmatory of aspirate findings  CLOT AND BIOPSY: The bone marrow clot and biopsy reveal hypercellular bone marrow with trilineage hematopoiesis.  Lymphoid aggregates comprising approximately 10% of cellularity are noted in the clot and biopsy section.  The findings are confirmatory of the aspirate and touch prep impression  SPECIAL STAINS: CD3: CD3 highlights T lymphocytes in the lymphoid aggregate CD20: CD20 highlights B lymphocytes in the lymphoid aggregate (more  than CD3) PAX5: PAX5 highlights B lymphocytes in the lymphoid aggregate Cyclin D1: Cyclin D1 is positive in the lymphoid aggregate  IRON STAIN: Iron stains are performed on a bone marrow aspirate or touch imprint smear and section of clot. The controls stained appropriately.       Storage Iron: Scant      Ring Sideroblasts: Not identified  ADDITIONAL DATA/TESTING: Cytogenetics   CELL COUNT DATA:  Bone Marrow count performed on 500 cells shows: Blasts:   0%   Myeloid:  41% Promyelocytes: 1%   Erythroid:     45% Myelocytes:    5%   Lymphocytes:   13% Metamyelocytes:     6%   Plasma cells:  1% Bands:    4% Neutrophils:   20%  M:E ratio:     0.91 Eosinophils:   5% Basophils:     0% Monocytes:     0%  Lab Data: CBC performed on 09/24/2024 shows: WBC: 4.54 k/uL Neutrophils:   54% Hgb: 13.5 g/dL Lymphocytes:   67% HCT: 41.6 %    Monocytes:     10% MCV: 89.3 fL   Eosinophils:   2% RDW: 13.6 %    Basophils:     2% PLT: 128 k/uL   RADIOGRAPHIC STUDIES: I have personally reviewed the radiological images as listed and agreed with the findings in the report. NM PET Image Initial (PI) Skull Base To Thigh (F-18 FDG) Result Date: 09/29/2024 EXAM: PET AND CT SKULL BASE TO MID THIGH 09/25/2024 08:49:01 AM TECHNIQUE: RADIOPHARMACEUTICAL: 7.94 mCi F-18 FDG Uptake time 60 minutes. Glucose level 94 mg/dl. PET imaging was acquired from the base of the skull to the mid thighs. Non-contrast enhanced computed tomography was obtained for attenuation correction and anatomic localization. COMPARISON: Neck and chest CT of 08/26/2024. abdominopelvic CT of 08/05/2024 also reviewed ([facility]). CLINICAL HISTORY: Hematologic malignancy, staging. FINDINGS: Blood pool activity SUV 2.4. Liver activity SUV 3.0. HEAD AND NECK: Bilateral hypermetabolic cervical nodes. Index low left jugular node measures 1.4 cm and SUV 6.4 on image 43/4. CHEST: Extensive bilateral axillary, mediastinal, and hilar hypermetabolic  adenopathy. Index right paratracheal node measures 1.9 cm and SUV 8.7 on image 58/4. Mild cardiomegaly. Aortic and coronary artery atherosclerosis. No hypermetabolic pulmonary nodules. Scattered subpleural pulmonary nodules including within the right middle lobe measuring up to 7 mm are likely subpleural lymph nodes. ABDOMEN AND PELVIS: Mild splenic hypermetabolism relative to the liver at SUV 4.3. splenomegaly at 14.6 cm on prior diagnostic CT. Abdominal pelvic hypermetabolic adenopathy. Index left periaortic node measures 1.8 cm and SUV 8.4 on image 132/4. A left external iliac / inguinal node measures 1.1 cm and SUV 5.8 on image 180/4. Normal adrenal glands. Bilateral renal cysts. Abdominal aortic atherosclerosis. Fat containing ventral abdominal wall hernia. Mild prostatomegaly with urolift implants and bilateral bladder diverticula. Physiologic activity  within the gastrointestinal and genitourinary systems. BONES AND SOFT TISSUE: Right hip arthroplasty. No abnormal FDG activity localizes to the bones. No metabolically active aggressive osseous lesion. IMPRESSION: 1. Evidence of active nodal lymphoma within the neck, chest, abdomen, and pelvis as detailed above. 2. Mild splenic hypermetabolism and splenomegaly, suspicious for splenic involvement with lymphoma. 3. Incidental findings, including: Aortic atherosclerosis ([id number]). Coronary artery atherosclerosis. Prostatomegaly with bladder outlet obstruction. Electronically signed by: Rockey Kilts MD 09/29/2024 09:05 AM EST RP Workstation: [id number]   IR LYMPH NODE CORE BIOPSY Result Date: 09/09/2024 INDICATION: 78 year old with lymphadenopathy. EXAM: ULTRASOUND-GUIDED CORE BIOPSY OF LEFT AXILLARY LYMPH NODE MEDICATIONS: 1% lidocaine  for local anesthetic ANESTHESIA/SEDATION: None FLUOROSCOPY TIME:  None COMPLICATIONS: None immediate. PROCEDURE: Informed written consent was obtained from the patient after a thorough discussion of the procedural risks,  benefits and alternatives. All questions were addressed. Maximal Sterile Barrier Technique was utilized including caps, mask, sterile gowns, sterile gloves, sterile drape, hand hygiene and skin antiseptic. A timeout was performed prior to the initiation of the procedure. Left axillary region was evaluated with ultrasound. Enlarged lymph nodes identified. Prominent superficial lymph node was targeted. Left axillary region was prepped with chlorhexidine  and sterile field was created. Skin was anesthetized using 1% lidocaine . Small incision was made. Using ultrasound guidance, 17 gauge coaxial needle was directed into the lymph node. Multiple core biopsies were obtained with an 18 gauge core device. Specimens placed on a Telfa pad with saline. 17 gauge needle was removed. Bandage placed over the puncture site. FINDINGS: Enlarged left axillary and left cervical lymph nodes. Superficial enlarged left axillary lymph node was biopsied. Biopsy needle confirmed within the lymph node. No immediate bleeding or hematoma formation. IMPRESSION: Ultrasound-guided core biopsy of an enlarged left axillary lymph node. Electronically Signed   By: Juliene Balder M.D.   On: 09/09/2024 12:47   CT Soft Tissue Neck W Contrast Result Date: 08/27/2024 CLINICAL DATA:  Abdominal adenopathy EXAM: CT NECK WITH CONTRAST TECHNIQUE: Multidetector CT imaging of the neck was performed using the standard protocol following the bolus administration of intravenous contrast. RADIATION DOSE REDUCTION: This exam was performed according to the departmental dose-optimization program which includes automated exposure control, adjustment of the mA and/or kV according to patient size and/or use of iterative reconstruction technique. CONTRAST:  75mL OMNIPAQUE  IOHEXOL  300 MG/ML  SOLN COMPARISON:  None Available. FINDINGS: Pharynx: The nasopharynx, oropharynx and hypopharynx are normal Oral cavity/floor of mouth: Normal Larynx: Normal Salivary glands: The parotid  and submandibular glands are normal Thyroid : Normal Lymph nodes: Upper cervical lymph nodes are more numerous than normally seen but are not pathologically enlarged. There are some enlarged lymph nodes at the base of the neck and in the retropectoral area and axilla. The largest lymph node at the base of the neck on the left measures 1.5 x 2.2 x 3.4 cm. There is superior mediastinal adenopathy. The largest lymph node is right paratracheal and measures 2.3 x 1.8 x 2.5 cm. Vascular: No significant abnormality Limited intracranial: No significant abnormality Visualized orbits: No significant abnormality Mastoids and visualized paranasal sinuses: No significant abnormality Skeleton: No significant abnormality Upper chest: The lungs are normal. There is superior mediastinal adenopathy as above. Other: None IMPRESSION: Cervical, axillary and mediastinal adenopathy. The largest lymph node at the base of the neck on the left measures 1.5 x 2.2 x 3.4 cm, just lateral to the thyroid  gland. This is amenable to biopsy if needed. Electronically Signed   By: Nancyann Burns M.D.   On: 08/27/2024  11:32   CT Chest W Contrast Result Date: 08/27/2024 CLINICAL DATA:  Lymphadenopathy, chest or axilla recent CT abdomen at [facility] urology showed multiple enlarged lymph nodes in the abdomen. need further assessment for CT chest and neck for possible biopsy. * Tracking Code: BO * EXAM: CT CHEST WITH CONTRAST TECHNIQUE: Multidetector CT imaging of the chest was performed during intravenous contrast administration. RADIATION DOSE REDUCTION: This exam was performed according to the departmental dose-optimization program which includes automated exposure control, adjustment of the mA and/or kV according to patient size and/or use of iterative reconstruction technique. CONTRAST:  75mL OMNIPAQUE  IOHEXOL  300 MG/ML  SOLN COMPARISON:  None Available. FINDINGS: Cardiovascular: Normal cardiac size. No pericardial effusion. No aortic aneurysm. There  are coronary artery calcifications, in keeping with coronary artery disease. Mediastinum/Nodes: Visualized thyroid  gland appears grossly unremarkable. No solid / cystic mediastinal masses. The esophagus is nondistended precluding optimal assessment. There are multiple enlarged mediastinal, bilateral hilar and axillary lymph nodes. The largest right upper paratracheal lymph node measures up to 16 x 24 mm. There is subcarinal nodal mass measuring up to 1.8 x 3.8 cm. Largest axillary lymph node is in the left axilla measuring up to 12 x 18 mm however, which exhibit preserved hilar fat. Lungs/Pleura: The central tracheo-bronchial tree is patent. There is an elongated/oval approximately 4 x 7 mm noncalcified opacity in the minor fissure (series 6, image 79), favored to represent intra fissural lymph node. There is a pleural-based 4 x 6 mm opacity in the middle lobe (series 6, image 75), which is indeterminate. No mass or consolidation. No pleural effusion or pneumothorax. Upper Abdomen: There are multiple mild-to-moderately enlarged retrocaval, portacaval, peripancreatic and para-lymph nodes, better evaluated on the CT scan abdomen and pelvis. There is enlarged spleen, incompletely imaged on this exam. Remaining visualized upper abdominal viscera within normal limits. Musculoskeletal: The visualized soft tissues of the chest wall are grossly unremarkable. No suspicious osseous lesions. There are mild to moderate multilevel degenerative changes in the visualized spine. IMPRESSION: 1. Multiple enlarged mediastinal, bilateral hilar and axillary lymph nodes, as described above. There are also multiple enlarged upper abdominal lymph nodes and enlarged spleen. These are nonspecific and differential diagnosis includes lymphoproliferative disorder, metastases, sarcoidosis, etc. 2. There is a pleural-based 4 x 6 mm opacity in the middle lobe, which is indeterminate. Attention on follow-up examination is recommended to document  stability. 3. Multiple other nonacute observations, as described above. Electronically Signed   By: Ree Molt M.D.   On: 08/27/2024 10:34    ASSESSMENT & PLAN:  78 y.o. male with  #1 Newly diagnosed stage IV mantle cell lymphoma.   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  PLTs slightly low, but not concerning and should improve.  Lymphocytes improving since infusion treatment. CMP stable.   - No notable toxicities noted from Rituximab  + Bendamustine .  Discussed he may expect to experience some cumulative fatigue, suppression of bone marrow. May need growth factor support currently, but maybe by the 6th cycle he will if indicated.   - If he does start to feel dryness (skin, eyes, mouth), suggest using a humidifier to improve this.  - Discussed that his weight should remain stable and he may even start beginning to gain weight soon.  - Urinary symptoms may improve with improvement in lymphoma.   Malodor is not concerning as long as he is not retaining urine or having signs of UTI. Could be from medications excreted in urine or tumor breakdown products.  - Continue Metoprolol  for Afib management and Eliquis  for anticoagulation.  FOLLOW-UP for cycle 2 of Bendamustine  Rituxan  with labs and visit with Johnston as  scheduled. Labs and MD visit cycle 2-day 15 for toxicity check. Please schedule cycle 3 and cycle 4 of Bendamustine  Rituxan  with labs and MD visits  The total time spent in the appointment was 30 minutes* .  All of the patient's questions were answered and the patient knows to call the clinic with any problems, questions, or concerns.  Emaline Saran MD MS AAHIVMS [facility] Hematology/Oncology Physician [facility]  *Total Encounter Time as defined by the Centers for Medicare and Medicaid Services includes, in addition to the face-to-face time of a patient visit (documented in the note above) non-face-to-face time: obtaining and reviewing outside history, ordering and reviewing medications, tests or procedures, care coordination (communications with other health care professionals or caregivers) and documentation in the medical record.  I,Emily Lagle,acting as a neurosurgeon for Emaline Saran, MD.,have documented all relevant documentation on the behalf of Emaline Saran, MD,as directed by  Emaline Saran, MD while in the presence of Emaline Saran, MD.  I have reviewed the above documentation for accuracy and completeness, and I agree with the above.  Emaline Saran, MD     [1]  Social History Tobacco Use   Smoking status: Some Days    Types: Cigars, Cigarettes   Smokeless tobacco: Never   Tobacco comments:    03-13-2018 per pt quit cigarettes 1990s, but currently smokes occasional cigar  Vaping Use   Vaping status: Never Used  Substance Use Topics   Alcohol use: Yes    Alcohol/week: 14.0 standard drinks of alcohol    Types: 14 Glasses of wine per week    Comment: average 2 wine daily   Drug use: No   "

## 2024-11-06 ENCOUNTER — Other Ambulatory Visit: Payer: Self-pay

## 2024-11-14 ENCOUNTER — Encounter: Payer: Self-pay | Admitting: Hematology

## 2024-11-16 NOTE — Progress Notes (Signed)
 Rapid Infusion Rituximab  Pharmacist Evaluation  Ojas C. Corradi is a 78 y.o. 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 This patient may be considered for RIR.   A pharmacist has verified the patient tolerated rituximab  infusions per the [facility] standard infusion protocol without grade 3-4 infusion reactions. The treatment plan will be updated to reflect RIR if the patient qualifies per the checklist below:   Age > 40 years old Yes   Clinically significant cardiovascular disease No   Circulating lymphocyte count < 5000/uL prior to cycle two Yes  Lab Results  Component Value Date   LYMPHSABS 0.6 (L) 11/05/2024    Prior documented grade 3-4 infusion reaction to rituximab  No   Prior documented grade 1-2 infusion reaction to rituximab  (If YES, Pharmacist will confirm with Physician if patient is still a candidate for RIR) No   Previous rituximab  infusion within the past 6 months Yes   Treatment Plan updated orders to reflect RIR Yes    Jacson C. Wulff does meet the criteria for Rapid Infusion Rituximab . This patient is going to be switched to rapid infusion rituximab .   Velecia Ovitt, PharmD, MBA

## 2024-11-16 NOTE — Progress Notes (Unsigned)
 " [facility] [phone]   [phone]  PROGRESS NOTE  Patient Care Team: Kip Righter, MD as PCP - General (Family Medicine) Shlomo Wilbert SAUNDERS, MD as PCP - Cardiology (Cardiology) Odean Potts, MD as Consulting Physician (Hematology and Oncology) Elana Montie CROME, RN as Oncology Nurse Navigator   CHIEF COMPLAINTS/PURPOSE OF CONSULTATION:  Mantle cell lymphoma   ONCOLOGIC HISTORY: August 2025: Initially evaluated for thrombocytopenia. Patient subsequently had gross hematuria and was evaluated by urology and had a CT of the abdomen done on 08/05/2024 with his urologist that showed splenomegaly and significant lymphadenopathy in the abdomen. 08/26/2024: CT of the chest and neck with Dr. Odean in October 2025 which showed a lymph node at the left base of the neck, multiple enlarged lymph nodes mediastinal, bilateral hilar and axillary.  Multiple enlarged upper abdominal lymph nodes and an enlarged spleen.  Pleural-based 4 x 6 mm opacity in the right middle lobe of the lung. 09/09/2024: Axillary lymph node biopsy showed a CD5 positive mature B-cell lymphoma consistent with mantle cell lymphoma. Bone marrow biopsy done on 09/24/2024 showed hypercellular marrow with 10% involvement with CD5 positive B-cell lymphoma with [employer] results showing translocation T (11;4 ) positive consistent with mantle cell lymphoma. 09/25/2024: PET scan showed nodal lymphoma within the neck, chest, abdomen and pelvis. Mild splenic hypermetabolism and splenomegaly.  10/19/2024: Cycle 1, Day 1 of R-Benda 11/17/2024: Cycle 2, Day 1 of R-Benda    INTERVAL HISTORY:  Scott Newton 78 y.o. male returns for a follow up for Cycle 2, Day 1 of R-Benda. He is unaccompanied for this visit.   On exam today, Scott Newton reports that he did tolerate the first cycle without any significant toxicities. His energy has improved this past week. He has started to do more stretching and walking  which has improved his body aches. He denies nausea, vomiting or bowel habit changes. He denies easy bruising or overt signs of bleeding. He does notice some dry skin which he is applying lotion daily. He reports his weight at home has been steady around 143.2 lbs. He reports having some shortness of breath mainly with exertion. He denies fevers, sweats, chest pain or cough. Rest of the ROS is below.   MEDICAL HISTORY:  Past Medical History:  Diagnosis Date   Anticoagulant long-term use    eliquis    BPH with obstruction/lower urinary tract symptoms    Chronic atrial fibrillation [facility]) cardiologist-  dr wilbert turner   dx 10/ 2016---  s/p DCCV with reversion back to afib now pursuing rate control. 10-19-2015   Dysrhythmia 2015   Afib   ED (erectile dysfunction)    Essential hypertension    Full dentures    Gross hematuria    Peyronie's disease    Umbilical hernia    Weak urinary stream    Wears glasses     SURGICAL HISTORY: Past Surgical History:  Procedure Laterality Date   CARDIOVERSION N/A 10/19/2015   Procedure: CARDIOVERSION;  Surgeon: Maude JAYSON Emmer, MD;  Location: [facility] ENDOSCOPY;  Service: Cardiovascular;  Laterality: N/A;   CYSTOSCOPY WITH FULGERATION N/A 03/18/2018   Procedure: CYSTOSCOPY WITH FULGERATION/ BLADDER BIOPSY;  Surgeon: Nieves Cough, MD;  Location: [facility];  Service: Urology;  Laterality: N/A;   CYSTOSCOPY WITH FULGERATION N/A 09/15/2019   Procedure: CYSTOSCOPY WITH FULGERATION/BLADDER BIOPSY/ BILATERAL RETROGRADE;  Surgeon: Nieves Cough, MD;  Location: [facility] ORS;  Service: Urology;  Laterality: N/A;   CYSTOSCOPY WITH INSERTION OF UROLIFT N/A 03/18/2018  Procedure: CYSTOSCOPY WITH INSERTION OF UROLIFT;  Surgeon: Nieves Cough, MD;  Location: [facility];  Service: Urology;  Laterality: N/A;   IR US  LIVER BIOPSY  09/09/2024   KNEE ARTHROSCOPY Left 2009  approx.   TONSILLECTOMY  child   TRANSTHORACIC ECHOCARDIOGRAM   09-27-2015  dr wilbert turner   mild focal basal hypertrophy of the septum,  ef 60-65%/  trivial AR/  mild MR/  severe LAE/      SOCIAL HISTORY: Social History   Socioeconomic History   Marital status: Married    Spouse name: Not on file   Number of children: 3   Years of education: college   Highest education level: Not on file  Occupational History   Occupation: unemployed  Tobacco Use   Smoking status: Some Days    Types: Cigars, Cigarettes   Smokeless tobacco: Never   Tobacco comments:    03-13-2018 per pt quit cigarettes 1990s, but currently smokes occasional cigar  Vaping Use   Vaping status: Never Used  Substance and Sexual Activity   Alcohol use: Yes    Alcohol/week: 14.0 standard drinks of alcohol    Types: 14 Glasses of wine per week    Comment: average 2 wine daily   Drug use: No   Sexual activity: Not on file  Other Topics Concern   Not on file  Social History Narrative   Not on file   Social Drivers of Health   Tobacco Use: High Risk (10/11/2024)   Patient History    Smoking Tobacco Use: Some Days    Smokeless Tobacco Use: Never    Passive Exposure: Not on file  Financial Resource Strain: Not on file  Food Insecurity: Patient Declined (09/26/2024)   Epic    Worried About Programme Researcher, Broadcasting/film/video in the Last Year: Patient declined    Barista in the Last Year: Patient declined  Transportation Needs: Patient Declined (09/26/2024)   Epic    Lack of Transportation (Medical): Patient declined    Lack of Transportation (Non-Medical): Patient declined  Physical Activity: Not on file  Stress: Not on file  Social Connections: Not on file  Intimate Partner Violence: Not At Risk (07/01/2024)   Epic    Fear of Current or Ex-Partner: No    Emotionally Abused: No    Physically Abused: No    Sexually Abused: No  Depression (PHQ2-9): Low Risk (07/01/2024)   Depression (PHQ2-9)    PHQ-2 Score: 0  Alcohol Screen: Not on file  Housing: Patient Declined  (09/26/2024)   Epic    Unable to Pay for Housing in the Last Year: Patient declined    Number of Times Moved in the Last Year: Not on file    Homeless in the Last Year: Patient declined  Utilities: Patient Declined (09/26/2024)   Epic    Threatened with loss of utilities: Patient declined  Health Literacy: Not on file    FAMILY HISTORY: Family History  Problem Relation Age of Onset   Heart disease Father 16   Heart attack Father 39   Hypertension Mother 40   Heart attack Brother 54   Stroke Neg Hx     ALLERGIES:  has no known allergies.  MEDICATIONS:  Current Outpatient Medications  Medication Sig Dispense Refill   acetaminophen  (TYLENOL ) 500 MG tablet Take 500 mg by mouth every 6 (six) hours as needed for moderate pain or headache.     acyclovir  (ZOVIRAX ) 400 MG tablet Take 1 tablet (400  mg total) by mouth 2 (two) times daily. 60 tablet 6   allopurinol  (ZYLOPRIM ) 100 MG tablet TAKE 1 TABLET BY MOUTH TWICE A DAY 180 tablet 1   apixaban  (ELIQUIS ) 5 MG TABS tablet Take 1 tablet (5 mg total) by mouth 2 (two) times daily. 180 tablet 1   dexamethasone  (DECADRON ) 4 MG tablet Take 2 tablets (8 mg total) by mouth daily. Start the day after bendamustine  chemotherapy for 2 days. Take with food. 30 tablet 1   finasteride (PROSCAR) 5 MG tablet Take 5 mg by mouth daily.     Magnesium 250 MG TABS Take 250 mg by mouth daily at 6 (six) AM.     metoprolol  succinate (TOPROL -XL) 50 MG 24 hr tablet TAKE 1.5 TABLETS EVERY MORNING AND 0.5 TAB EVERY EVENING TAKE WITH OR IMMEDIATELY FOLLOWING A MEAL. 180 tablet 0   Multiple Vitamins-Minerals (MULTIVITAMIN WITH MINERALS) tablet Take 1 tablet by mouth 2 (two) times a week.      ondansetron  (ZOFRAN ) 8 MG tablet Take 1 tablet (8 mg total) by mouth every 8 (eight) hours as needed for nausea or vomiting. Start on the third day after chemotherapy. 30 tablet 1   prochlorperazine  (COMPAZINE ) 10 MG tablet Take 1 tablet (10 mg total) by mouth every 6 (six) hours as  needed for nausea or vomiting. 30 tablet 1   sildenafil (REVATIO) 20 MG tablet Take 20-100 mg by mouth daily as needed for erectile dysfunction.     tamsulosin (FLOMAX) 0.4 MG CAPS capsule Take 0.4 mg by mouth daily.     No current facility-administered medications for this visit.    REVIEW OF SYSTEMS:   Constitutional: ( - ) fevers, ( - )  chills , ( - ) night sweats Eyes: ( - ) blurriness of vision, ( - ) double vision, ( - ) watery eyes Ears, nose, mouth, throat, and face: ( - ) mucositis, ( - ) sore throat Respiratory: ( - ) cough, ( - ) dyspnea, ( - ) wheezes Cardiovascular: ( - ) palpitation, ( - ) chest discomfort, ( - ) lower extremity swelling Gastrointestinal:  ( - ) nausea, ( - ) heartburn, ( - ) change in bowel habits Skin: ( - ) abnormal skin rashes Lymphatics: ( - ) new lymphadenopathy, ( - ) easy bruising Neurological: ( - ) numbness, ( - ) tingling, ( - ) new weaknesses Behavioral/Psych: ( - ) mood change, ( - ) new changes  All other systems were reviewed with the patient and are negative.  PHYSICAL EXAMINATION: ECOG PERFORMANCE STATUS: 1 - Symptomatic but completely ambulatory   Day 1, Cycle 2 11/17/24  Weight 146 lb (66.2 kg)  Temp 97.7 F (36.5 C)  Temp src Oral  Pulse 87  Resp 18  BP 116/73   There were no vitals filed for this visit. There were no vitals filed for this visit.  GENERAL: well appearing male in NAD  SKIN: skin color, texture, turgor are normal, no rashes or significant lesions EYES: conjunctiva are pink and non-injected, sclera clear LUNGS: clear to auscultation and percussion with normal breathing effort HEART: regular rate & rhythm and no murmurs and no lower extremity edema Musculoskeletal: no cyanosis of digits and no clubbing  PSYCH: alert & oriented x 3, fluent speech NEURO: no focal motor/sensory deficits  LABORATORY DATA:  I have reviewed the data as listed    Latest Ref Rng & Units 11/05/2024    8:26 AM 10/19/2024    1:47  PM  10/12/2024    4:10 PM  CBC  WBC 4.0 - 10.5 K/uL 4.0  6.5  5.8   Hemoglobin 13.0 - 17.0 g/dL 86.8  86.9  85.5   Hematocrit 39.0 - 52.0 % 39.4  38.9  43.4   Platelets 150 - 400 K/uL 100  188  236        Latest Ref Rng & Units 11/05/2024    8:26 AM 10/19/2024    1:47 PM 10/12/2024    4:10 PM  CMP  Glucose 70 - 99 mg/dL 876  853  893   BUN 8 - 23 mg/dL 16  12  19    Creatinine 0.61 - 1.24 mg/dL 9.18  9.16  9.16   Sodium 135 - 145 mmol/L 137  137  136   Potassium 3.5 - 5.1 mmol/L 4.2  4.7  4.2   Chloride 98 - 111 mmol/L 102  105  99   CO2 22 - 32 mmol/L 27  25  27    Calcium 8.9 - 10.3 mg/dL 9.1  8.7  9.1   Total Protein 6.5 - 8.1 g/dL 6.8  6.9  7.7   Total Bilirubin 0.0 - 1.2 mg/dL 0.6  0.3  0.6   Alkaline Phos 38 - 126 U/L 138  141  170   AST 15 - 41 U/L 25  41  30   ALT 0 - 44 U/L 18  20  20      RADIOGRAPHIC STUDIES: I have personally reviewed the radiological images as listed and agreed with the findings in the report. No results found.  ASSESSMENT & PLAN Scott Newton is a 78 y.o. male who presents to the clinic for continued management of mantle cell lymphoma.   #Mantle Cell Lymphoma: --Axillary lymph node biopsy showed a CD5 positive mature B-cell lymphoma consistent with mantle cell lymphoma. --Bone marrow biopsy done on 09/24/2024 showed hypercellular marrow with 10% involvement with CD5 positive B-cell lymphoma with [employer] results showing translocation T (11;4 ) positive consistent with mantle cell lymphoma. --Pre-treatment PET scan showed nodal lymphoma within the neck, chest, abdomen and pelvis. Mild splenic hypermetabolism and splenomegaly.  --Started Cycle 1, Day 1 of R-Benda on 10/19/2024.  PLAN: --Due for Cycle 2, Day 1 of R-Benda.  --Labs from today show WBC 3.3, Hgb 12.8, Plt 153K, creatinine and LFTs are in range. --Proceed with treatment without any dose modifications --Encouraged to eat small, frequent meals and supplement with protein shakes --Try  to walk everyday and complete daily stretches.  --RTC in 4 weeks with labs and follow up prior to Cycle 3, Day 1 of R-Benda.   No orders of the defined types were placed in this encounter.   All questions were answered. The patient knows to call the clinic with any problems, questions or concerns.  I have spent a total of 30 minutes minutes of face-to-face and non-face-to-face time, preparing to see the patient, performing a medically appropriate examination, counseling and educating the patient, documenting clinical information in the electronic health record, independently interpreting results and communicating results to the patient, and care coordination.   Johnston Police, PA-C Department of Hematology/Oncology [facility] Phone: [phone] "

## 2024-11-17 ENCOUNTER — Inpatient Hospital Stay: Admitting: Dietician

## 2024-11-17 ENCOUNTER — Inpatient Hospital Stay

## 2024-11-17 ENCOUNTER — Inpatient Hospital Stay (HOSPITAL_BASED_OUTPATIENT_CLINIC_OR_DEPARTMENT_OTHER): Admitting: Physician Assistant

## 2024-11-17 VITALS — BP 118/69 | HR 85 | Temp 97.5°F | Resp 16 | Wt 146.0 lb

## 2024-11-17 DIAGNOSIS — C8312 Mantle cell lymphoma, intrathoracic lymph nodes: Secondary | ICD-10-CM

## 2024-11-17 DIAGNOSIS — Z5112 Encounter for antineoplastic immunotherapy: Secondary | ICD-10-CM

## 2024-11-17 DIAGNOSIS — Z5111 Encounter for antineoplastic chemotherapy: Secondary | ICD-10-CM

## 2024-11-17 LAB — CBC WITH DIFFERENTIAL (CANCER CENTER ONLY)
Abs Immature Granulocytes: 0.01 K/uL (ref 0.00–0.07)
Basophils Absolute: 0 K/uL (ref 0.0–0.1)
Basophils Relative: 1 %
Eosinophils Absolute: 0.3 K/uL (ref 0.0–0.5)
Eosinophils Relative: 9 %
HCT: 37.5 % — ABNORMAL LOW (ref 39.0–52.0)
Hemoglobin: 12.8 g/dL — ABNORMAL LOW (ref 13.0–17.0)
Immature Granulocytes: 0 %
Lymphocytes Relative: 10 %
Lymphs Abs: 0.3 K/uL — ABNORMAL LOW (ref 0.7–4.0)
MCH: 29.8 pg (ref 26.0–34.0)
MCHC: 34.1 g/dL (ref 30.0–36.0)
MCV: 87.2 fL (ref 80.0–100.0)
Monocytes Absolute: 0.6 K/uL (ref 0.1–1.0)
Monocytes Relative: 20 %
Neutro Abs: 2 K/uL (ref 1.7–7.7)
Neutrophils Relative %: 60 %
Platelet Count: 153 K/uL (ref 150–400)
RBC: 4.3 MIL/uL (ref 4.22–5.81)
RDW: 15.5 % (ref 11.5–15.5)
WBC Count: 3.3 K/uL — ABNORMAL LOW (ref 4.0–10.5)
nRBC: 0 % (ref 0.0–0.2)

## 2024-11-17 LAB — CMP (CANCER CENTER ONLY)
ALT: 21 U/L (ref 0–44)
AST: 27 U/L (ref 15–41)
Albumin: 3.8 g/dL (ref 3.5–5.0)
Alkaline Phosphatase: 142 U/L — ABNORMAL HIGH (ref 38–126)
Anion gap: 8 (ref 5–15)
BUN: 13 mg/dL (ref 8–23)
CO2: 25 mmol/L (ref 22–32)
Calcium: 9 mg/dL (ref 8.9–10.3)
Chloride: 104 mmol/L (ref 98–111)
Creatinine: 0.74 mg/dL (ref 0.61–1.24)
GFR, Estimated: >60 mL/min
Glucose, Bld: 98 mg/dL (ref 70–99)
Potassium: 4.3 mmol/L (ref 3.5–5.1)
Sodium: 138 mmol/L (ref 135–145)
Total Bilirubin: 0.5 mg/dL (ref 0.0–1.2)
Total Protein: 6.6 g/dL (ref 6.5–8.1)

## 2024-11-17 MED ORDER — SODIUM CHLORIDE 0.9 % IV SOLN
90.0000 mg/m2 | Freq: Once | INTRAVENOUS | Status: AC
  Administered 2024-11-17: 170 mg via INTRAVENOUS
  Filled 2024-11-17: qty 6.8

## 2024-11-17 MED ORDER — DIPHENHYDRAMINE HCL 25 MG PO CAPS
50.0000 mg | ORAL_CAPSULE | Freq: Once | ORAL | Status: AC
  Administered 2024-11-17: 50 mg via ORAL
  Filled 2024-11-17: qty 2

## 2024-11-17 MED ORDER — SODIUM CHLORIDE 0.9 % IV SOLN: INTRAVENOUS | Status: DC

## 2024-11-17 MED ORDER — ACETAMINOPHEN 325 MG PO TABS
650.0000 mg | ORAL_TABLET | Freq: Once | ORAL | Status: AC
  Administered 2024-11-17: 650 mg via ORAL
  Filled 2024-11-17: qty 2

## 2024-11-17 MED ORDER — SODIUM CHLORIDE 0.9 % IV SOLN
375.0000 mg/m2 | Freq: Once | INTRAVENOUS | Status: AC
  Administered 2024-11-17: 700 mg via INTRAVENOUS
  Filled 2024-11-17: qty 50

## 2024-11-17 MED ORDER — PALONOSETRON HCL INJECTION 0.25 MG/5ML
0.2500 mg | Freq: Once | INTRAVENOUS | Status: AC
  Administered 2024-11-17: 0.25 mg via INTRAVENOUS
  Filled 2024-11-17: qty 5

## 2024-11-17 MED ORDER — DEXAMETHASONE SOD PHOSPHATE PF 10 MG/ML IJ SOLN
10.0000 mg | Freq: Once | INTRAMUSCULAR | Status: AC
  Administered 2024-11-17: 10 mg via INTRAVENOUS

## 2024-11-17 NOTE — Progress Notes (Signed)
 Nutrition Assessment   Reason for Assessment: Referral - wt loss    ASSESSMENT: 78 year old male with Mantle cell NHL. He is receiving rituximab  + bendamustine  q28d (start 12/3). Patient is under the care of Dr. Onesimo.   Past medical history includes HTN, chronic atrial fibrillation, thrombocytopenia, umbilical hernia  Met with patient in infusion. He is snacking on trail mix at visit. Patient reports good appetite and is eating all through out the day. His wife is a acupuncturist. Patient is tolerating treatment well overall. Noting increased fatigue as well as queasiness in the morning. Patient reports trying to eat a few saltines with his coffee which was helpful. He has not tried antiemetics. Denies constipation or diarrhea.   Nutrition Focused Physical Exam: deferred   Medications: acyclovir , allopurinol , eliquis , decadron , proscar, Mg, metoprolol , MVI, zofran , compazine , revatio, flomax   Labs: reviewed    Anthropometrics:   Height: 5'9 Weight: 146 lb  UBW: 178 lb (01/06/24) BMI: 21.56    NUTRITION DIAGNOSIS: Unintended wt loss related to cancer as evidenced by 18% decrease from usual weight over the last 10 months, 5.2% wt loss in 4 weeks. Pt 154 lb 12 oz on 12/1. Weight loss is severe for time frame   INTERVENTION:  Educated on rationale for high calorie high protein diet to meet increased metabolic demands - handout provided Encourage smaller more frequent meals/snacks with adequate calories and protein - snack ideas provided  Discussed strategies for adding calories/protein to foods - swapping milk for water , adding butter/cheese/gravy/sauces, using protein powder in smoothies/shakes - shake recipes + soft moist high protein foods list provided  Discussed foods with protein, recommend protein source at every meal Pt does not care for Boost/Ensure. He does drink milk - provided samples of CIB powder to try. Suggested switching to fairlife milk for added protein  Pt  agreeable to adding in bedtime snack  Encourage daily activity as able  Contact information   MONITORING, EVALUATION, GOAL: Patient will tolerate increased calories and protein to promote wt gain/maintenance   Next Visit: Tuesday January 26 during infusion

## 2024-11-17 NOTE — Patient Instructions (Signed)
 [facility] - A DEPT OF [facility]  [facility]  Discharge Instructions: Thank you for choosing [facility] to provide your oncology and hematology care.   If you have a lab appointment with the Cancer Center, please go directly to the Cancer Center and check in at the registration area.   Wear comfortable clothing and clothing appropriate for easy access to any Portacath or PICC line.   We strive to give you quality time with your provider. You may need to reschedule your appointment if you arrive late (15 or more minutes).  Arriving late affects you and other patients whose appointments are after yours.  Also, if you miss three or more appointments without notifying the office, you may be dismissed from the clinic at the provider's discretion.      For prescription refill requests, have your pharmacy contact our office and allow 72 hours for refills to be completed.    Today you received the following chemotherapy and/or immunotherapy agents Rituximab, Bendamustine      To help prevent nausea and vomiting after your treatment, we encourage you to take your nausea medication as directed.  BELOW ARE SYMPTOMS THAT SHOULD BE REPORTED IMMEDIATELY: *FEVER GREATER THAN 100.4 F (38 C) OR HIGHER *CHILLS OR SWEATING *NAUSEA AND VOMITING THAT IS NOT CONTROLLED WITH YOUR NAUSEA MEDICATION *UNUSUAL SHORTNESS OF BREATH *UNUSUAL BRUISING OR BLEEDING *URINARY PROBLEMS (pain or burning when urinating, or frequent urination) *BOWEL PROBLEMS (unusual diarrhea, constipation, pain near the anus) TENDERNESS IN MOUTH AND THROAT WITH OR WITHOUT PRESENCE OF ULCERS (sore throat, sores in mouth, or a toothache) UNUSUAL RASH, SWELLING OR PAIN  UNUSUAL VAGINAL DISCHARGE OR ITCHING   Items with * indicate a potential emergency and should be followed up as soon as possible or go to the Emergency Department if any problems should occur.  Please show the CHEMOTHERAPY ALERT CARD or  IMMUNOTHERAPY ALERT CARD at check-in to the Emergency Department and triage nurse.  Should you have questions after your visit or need to cancel or reschedule your appointment, please contact [facility] - A DEPT OF [facility]  Dept: [phone]  and follow the prompts.  Office hours are 8:00 a.m. to 4:30 p.m. Monday - Friday. Please note that voicemails left after 4:00 p.m. may not be returned until the following business day.  We are closed weekends and major holidays. You have access to a nurse at all times for urgent questions. Please call the main number to the clinic Dept: [phone] and follow the prompts.   For any non-urgent questions, you may also contact your provider using MyChart. We now offer e-Visits for anyone 43 and older to request care online for non-urgent symptoms. For details visit [email or web address].   Also download the MyChart app! Go to the app store, search MyChart, open the app, select [place], and log in with your MyChart username and password.  Rituximab Injection What is this medication? RITUXIMAB (ri TUX i mab) treats leukemia and lymphoma. It works by blocking a protein that causes cancer cells to grow and multiply. This helps to slow or stop the spread of cancer cells. It may also be used to treat autoimmune conditions, such as arthritis. It works by slowing down an overactive immune system. It is a monoclonal antibody. This medicine may be used for other purposes; ask your health care provider or pharmacist if you have questions. COMMON BRAND NAME(S): RIABNI, Rituxan, RUXIENCE,  truxima What should I tell my care team before I take this medication? They need to know if you have any of these conditions: Chest pain Heart disease Immune system problems Infection, such as chickenpox, cold sores, hepatitis B, herpes Irregular heartbeat or rhythm Kidney disease Low blood counts, such as low white cells, platelets, red  cells Lung disease Recent or upcoming vaccine An unusual or allergic reaction to rituximab, other medications, foods, dyes, or preservatives Pregnant or trying to get pregnant Breast-feeding How should I use this medication? This medication is injected into a vein. It is given by a care team in a hospital or clinic setting. A special MedGuide will be given to you before each treatment. Be sure to read this information carefully each time. Talk to your care team about the use of this medication in children. While this medication may be prescribed for children as young as 6 months for selected conditions, precautions do apply. Overdosage: If you think you have taken too much of this medicine contact a poison control center or emergency room at once. NOTE: This medicine is only for you. Do not share this medicine with others. What if I miss a dose? Keep appointments for follow-up doses. It is important not to miss your dose. Call your care team if you are unable to keep an appointment. What may interact with this medication? Do not take this medication with any of the following: Live vaccines This medication may also interact with the following: Cisplatin This list may not describe all possible interactions. Give your health care provider a list of all the medicines, herbs, non-prescription drugs, or dietary supplements you use. Also tell them if you smoke, drink alcohol, or use illegal drugs. Some items may interact with your medicine. What should I watch for while using this medication? Your condition will be monitored carefully while you are receiving this medication. You may need blood work while taking this medication. This medication can cause serious infusion reactions. To reduce the risk your care team may give you other medications to take before receiving this one. Be sure to follow the directions from your care team. This medication may increase your risk of getting an infection. Call  your care team for advice if you get a fever, chills, sore throat, or other symptoms of a cold or flu. Do not treat yourself. Try to avoid being around people who are sick. Call your care team if you are around anyone with measles, chickenpox, or if you develop sores or blisters that do not heal properly. Avoid taking medications that contain aspirin, acetaminophen , ibuprofen, naproxen, or ketoprofen unless instructed by your care team. These medications may hide a fever. This medication may cause serious skin reactions. They can happen weeks to months after starting the medication. Contact your care team right away if you notice fevers or flu-like symptoms with a rash. The rash may be red or purple and then turn into blisters or peeling of the skin. You may also notice a red rash with swelling of the face, lips, or lymph nodes in your neck or under your arms. In some patients, this medication may cause a serious brain infection that may cause death. If you have any problems seeing, thinking, speaking, walking, or standing, tell your care team right away. If you cannot reach your care team, urgently seek another source of medical care. Talk to your care team if you may be pregnant. Serious birth defects can occur if you take this medication during  pregnancy and for 12 months after the last dose. You will need a negative pregnancy test before starting this medication. Contraception is recommended while taking this medication and for 12 months after the last dose. Your care team can help you find the option that works for you. Do not breastfeed while taking this medication and for at least 6 months after the last dose. What side effects may I notice from receiving this medication? Side effects that you should report to your care team as soon as possible: Allergic reactions or angioedema--skin rash, itching or hives, swelling of the face, eyes, lips, tongue, arms, or legs, trouble swallowing or  breathing Bowel blockage--stomach cramping, unable to have a bowel movement or pass gas, loss of appetite, vomiting Dizziness, loss of balance or coordination, confusion or trouble speaking Heart attack--pain or tightness in the chest, shoulders, arms, or jaw, nausea, shortness of breath, cold or clammy skin, feeling faint or lightheaded Heart rhythm changes--fast or irregular heartbeat, dizziness, feeling faint or lightheaded, chest pain, trouble breathing Infection--fever, chills, cough, sore throat, wounds that don't heal, pain or trouble when passing urine, general feeling of discomfort or being unwell Infusion reactions--chest pain, shortness of breath or trouble breathing, feeling faint or lightheaded Kidney injury--decrease in the amount of urine, swelling of the ankles, hands, or feet Liver injury--right upper belly pain, loss of appetite, nausea, light-colored stool, dark yellow or brown urine, yellowing skin or eyes, unusual weakness or fatigue Redness, blistering, peeling, or loosening of the skin, including inside the mouth Stomach pain that is severe, does not go away, or gets worse Tumor lysis syndrome (TLS)--nausea, vomiting, diarrhea, decrease in the amount of urine, dark urine, unusual weakness or fatigue, confusion, muscle pain or cramps, fast or irregular heartbeat, joint pain Side effects that usually do not require medical attention (report to your care team if they continue or are bothersome): Headache Joint pain Nausea Runny or stuffy nose Unusual weakness or fatigue This list may not describe all possible side effects. Call your doctor for medical advice about side effects. You may report side effects to FDA at 1-800-FDA-1088. Where should I keep my medication? This medication is given in a hospital or clinic. It will not be stored at home. NOTE: This sheet is a summary. It may not cover all possible information. If you have questions about this medicine, talk to your  doctor, pharmacist, or health care provider.  2024 Elsevier/Gold Standard (2022-03-29 00:00:00)  Bendamustine Injection What is this medication? BENDAMUSTINE (BEN da MUS teen) treats leukemia and lymphoma. It works by slowing down the growth of cancer cells. This medicine may be used for other purposes; ask your health care provider or pharmacist if you have questions. COMMON BRAND NAME(S): BELRAPZO, BENDEKA, Treanda, VIVIMUSTA What should I tell my care team before I take this medication? They need to know if you have any of these conditions: Infection, especially a viral infection, such as chickenpox, cold sores, herpes Kidney disease Liver disease An unusual or allergic reaction to bendamustine, mannitol, other medications, foods, dyes, or preservatives Pregnant or trying to get pregnant Breast-feeding How should I use this medication? This medication is injected into a vein. It is given by your care team in a hospital or clinic setting. Talk to your care team about the use of this medication in children. Special care may be needed. Overdosage: If you think you have taken too much of this medicine contact a poison control center or emergency room at once. NOTE: This medicine is  only for you. Do not share this medicine with others. What if I miss a dose? Keep appointments for follow-up doses. It is important not to miss your dose. Call your care team if you are unable to keep an appointment. What may interact with this medication? Do not take this medication with any of the following: Clozapine This medication may also interact with the following: Atazanavir Cimetidine Ciprofloxacin Enoxacin Fluvoxamine Medications for seizures, such as carbamazepine, phenobarbital Mexiletine Rifampin Tacrine Thiabendazole Zileuton This list may not describe all possible interactions. Give your health care provider a list of all the medicines, herbs, non-prescription drugs, or dietary  supplements you use. Also tell them if you smoke, drink alcohol, or use illegal drugs. Some items may interact with your medicine. What should I watch for while using this medication? Visit your care team for regular checks on your progress. This medication may make you feel generally unwell. This is not uncommon, as chemotherapy can affect healthy cells as well as cancer cells. Report any side effects. Continue your course of treatment even though you feel ill unless your care team tells you to stop. You may need blood work while taking this medication. This medication may increase your risk of getting an infection. Call your care team for advice if you get a fever, chills, sore throat, or other symptoms of a cold or flu. Do not treat yourself. Try to avoid being around people who are sick. This medication may cause serious skin reactions. They can happen weeks to months after starting the medication. Contact your care team right away if you notice fevers or flu-like symptoms with a rash. The rash may be red or purple and then turn into blisters or peeling of the skin. You may also notice a red rash with swelling of the face, lips, or lymph nodes in your neck or under your arms. In some patients, this medication may cause a serious brain infection that may cause death. If you have any problems seeing, thinking, speaking, walking, or standing, tell your care team right away. If you cannot reach your care team, urgently seek other source of medical care. This medication may increase your risk to bruise or bleed. Call your care team if you notice any unusual bleeding. Talk to your care team about your risk of cancer. You may be more at risk for certain types of cancer if you take this medication. Talk to your care team about your risk of skin cancer. You may be more at risk for skin cancer if you take this medication. Talk to your care team if you or your partner wish to become pregnant or think either of  you might be pregnant. This medication can cause serious birth defects if taken during pregnancy or for up to 6 months after the last dose. A negative pregnancy test is required before starting this medication. A reliable form of contraception is recommended while taking this medication and for 6 months after the last dose. Talk to your care team about reliable forms of contraception. Wear a condom while taking this medication and for at least 3 months after the last dose. Do not breast-feed while taking this medication or for at least 1 week after the last dose. This medication may cause infertility. Talk to your care team if you are concerned about your fertility. What side effects may I notice from receiving this medication? Side effects that you should report to your care team as soon as possible: Allergic reactions--skin rash, itching,  hives, swelling of the face, lips, tongue, or throat Infection--fever, chills, cough, sore throat, wounds that don't heal, pain or trouble when passing urine, general feeling of discomfort or being unwell Infusion reactions--chest pain, shortness of breath or trouble breathing, feeling faint or lightheaded Liver injury--right upper belly pain, loss of appetite, nausea, light-colored stool, dark yellow or brown urine, yellowing skin or eyes, unusual weakness or fatigue Low red blood cell level--unusual weakness or fatigue, dizziness, headache, trouble breathing Painful swelling, warmth, or redness of the skin, blisters or sores at the infusion site Rash, fever, and swollen lymph nodes Redness, blistering, peeling, or loosening of the skin, including inside the mouth Tumor lysis syndrome (TLS)--nausea, vomiting, diarrhea, decrease in the amount of urine, dark urine, unusual weakness or fatigue, confusion, muscle pain or cramps, fast or irregular heartbeat, joint pain Unusual bruising or bleeding Side effects that usually do not require medical attention (report to  your care team if they continue or are bothersome): Diarrhea Fatigue Headache Loss of appetite Nausea Vomiting This list may not describe all possible side effects. Call your doctor for medical advice about side effects. You may report side effects to FDA at 1-800-FDA-1088. Where should I keep my medication? This medication is given in a hospital or clinic. It will not be stored at home. NOTE: This sheet is a summary. It may not cover all possible information. If you have questions about this medicine, talk to your doctor, pharmacist, or health care provider.  2024 Elsevier/Gold Standard (2022-02-27 00:00:00)

## 2024-11-18 ENCOUNTER — Inpatient Hospital Stay

## 2024-11-18 VITALS — BP 102/67 | HR 74 | Temp 99.0°F | Resp 16

## 2024-11-18 DIAGNOSIS — Z5112 Encounter for antineoplastic immunotherapy: Secondary | ICD-10-CM | POA: Diagnosis not present

## 2024-11-18 DIAGNOSIS — C8312 Mantle cell lymphoma, intrathoracic lymph nodes: Secondary | ICD-10-CM

## 2024-11-18 MED ORDER — DEXAMETHASONE SOD PHOSPHATE PF 10 MG/ML IJ SOLN
10.0000 mg | Freq: Once | INTRAMUSCULAR | Status: AC
  Administered 2024-11-18: 10 mg via INTRAVENOUS

## 2024-11-18 MED ORDER — SODIUM CHLORIDE 0.9 % IV SOLN: INTRAVENOUS | Status: DC

## 2024-11-18 MED ORDER — SODIUM CHLORIDE 0.9 % IV SOLN
90.0000 mg/m2 | Freq: Once | INTRAVENOUS | Status: AC
  Administered 2024-11-18: 170 mg via INTRAVENOUS
  Filled 2024-11-18: qty 6.8

## 2024-11-18 NOTE — Patient Instructions (Signed)
 [facility] - A DEPT OF [place]  [facility]  Discharge Instructions: Thank you for choosing [facility] to provide your oncology and hematology care.   If you have a lab appointment with the Cancer Center, please go directly to the Cancer Center and check in at the registration area.   Wear comfortable clothing and clothing appropriate for easy access to any Portacath or PICC line.   We strive to give you quality time with your provider. You may need to reschedule your appointment if you arrive late (15 or more minutes).  Arriving late affects you and other patients whose appointments are after yours.  Also, if you miss three or more appointments without notifying the office, you may be dismissed from the clinic at the providers discretion.      For prescription refill requests, have your pharmacy contact our office and allow 72 hours for refills to be completed.    Today you received the following chemotherapy and/or immunotherapy agents BENDAMUSTINE    To help prevent nausea and vomiting after your treatment, we encourage you to take your nausea medication as directed.  BELOW ARE SYMPTOMS THAT SHOULD BE REPORTED IMMEDIATELY: *FEVER GREATER THAN 100.4 F (38 C) OR HIGHER *CHILLS OR SWEATING *NAUSEA AND VOMITING THAT IS NOT CONTROLLED WITH YOUR NAUSEA MEDICATION *UNUSUAL SHORTNESS OF BREATH *UNUSUAL BRUISING OR BLEEDING *URINARY PROBLEMS (pain or burning when urinating, or frequent urination) *BOWEL PROBLEMS (unusual diarrhea, constipation, pain near the anus) TENDERNESS IN MOUTH AND THROAT WITH OR WITHOUT PRESENCE OF ULCERS (sore throat, sores in mouth, or a toothache) UNUSUAL RASH, SWELLING OR PAIN  UNUSUAL VAGINAL DISCHARGE OR ITCHING   Items with * indicate a potential emergency and should be followed up as soon as possible or go to the Emergency Department if any problems should occur.  Please show the CHEMOTHERAPY ALERT CARD or IMMUNOTHERAPY  ALERT CARD at check-in to the Emergency Department and triage nurse.  Should you have questions after your visit or need to cancel or reschedule your appointment, please contact [facility] - A DEPT OF [facility]  Dept: [phone]  and follow the prompts.  Office hours are 8:00 a.m. to 4:30 p.m. Monday - Friday. Please note that voicemails left after 4:00 p.m. may not be returned until the following business day.  We are closed weekends and major holidays. You have access to a nurse at all times for urgent questions. Please call the main number to the clinic Dept: [phone] and follow the prompts.   For any non-urgent questions, you may also contact your provider using MyChart. We now offer e-Visits for anyone 59 and older to request care online for non-urgent symptoms. For details visit [email or web address].   Also download the MyChart app! Go to the app store, search MyChart, open the app, select [place], and log in with your MyChart username and password.

## 2024-11-25 ENCOUNTER — Other Ambulatory Visit: Payer: Self-pay

## 2024-12-08 NOTE — Progress Notes (Signed)
 "  Primary Care Physician: Kip Righter, MD Primary Cardiologist: Wilbert Bihari, MD Electrophysiologist: None  Referring Physician: Wilbert Bihari, MD  Scott Newton is a 79 y.o. 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  The patient was initially diagnosed with atrial fibrillation in 2016 and was started on Eliquis  and underwent DCCV on 10/19/2015.  He was seen in follow-up 1 month later and had early return of AF and was then referred to the A-fib clinic in 2017.  During that time he elected not to pursue any rate control strategies due to being asymptomatic.  He was diagnosed with B-cell lymphoma in 08/2024 and was started on chemotherapy on 10/19/2024.  He contacted our office on 11/03/2024 with complaint of symptomatic AF returning during oncology infusion appointment.  He denies any dizziness or shortness of breath but felt flutters in his chest.  He was referred to the AF clinic to discuss breakthrough episodes on AV nodal agent and to assess AF burden.  Scott Newton presents today for follow-up in the AF clinic. He has a history of atrial fibrillation and is currently undergoing chemotherapy. Approximately six weeks ago, he experienced a breakthrough episode of atrial fibrillation after his first chemotherapy treatment. This episode occurred at night, which was unusual for him as he typically does not feel his atrial fibrillation. He attributes this episode to anxiety related to his new treatment regimen. Since the initial episode, he has not experienced any further symptoms of atrial fibrillation. It has been over twenty days since his last treatment, and he is on a 28-day chemotherapy cycle. He has not missed any doses of his blood thinner. No bleeding issues are reported, and his blood pressure has been consistently good throughout his  treatments. He is currently taking metoprolol , 75 mg in the morning and 25 mg in the evening. He recalls a previous increase in his metoprolol  dosage from 25 mg twice daily to 50 mg in the morning and 25 mg in the evening, which was adjusted a few years ago. He has a history of anxiety, which he believes contributed to his initial episode of atrial fibrillation during chemotherapy. His anxiety has decreased since the start of his treatment, as he has become more accustomed to the process. He is concerned about potential transportation issues for his upcoming chemotherapy appointment due to expected bad weather. He typically drives himself, is driven by his wife, or uses [employer] for transportation. He has not utilized any transportation services provided by the cancer center. Today, he denies symptoms of 100 palpitations, chest pain, shortness of breath, orthopnea, PND, lower extremity edema, dizziness, presyncope, syncope, snoring, daytime somnolence, bleeding, or neurologic sequela. The patient is tolerating medications without difficulties and is otherwise without complaint today.   Discussed the use of AI scribe software for clinical note transcription with the patient, who gave verbal consent to proceed.   Atrial Fibrillation Management history: Patient has family history of atrial fibrillation Previous antiarrhythmic drugs: None Previous cardioversions: None Previous ablations: None Anticoagulation history: Eliquis   ROS- All systems are reviewed and negative except as per the HPI above.  Past Medical History:  Diagnosis Date   Anticoagulant long-term use    eliquis    BPH with obstruction/lower urinary tract symptoms    Chronic atrial fibrillation [facility]) cardiologist-  dr wilbert turner   dx 10/ 2016---  s/p DCCV with reversion back to  afib now pursuing rate control. 10-19-2015   Dysrhythmia 2015   Afib   ED (erectile dysfunction)    Essential hypertension    Full dentures    Gross hematuria     Peyronie's disease    Umbilical hernia    Weak urinary stream    Wears glasses    Past Surgical History:  Procedure Laterality Date   CARDIOVERSION N/A 10/19/2015   Procedure: CARDIOVERSION;  Surgeon: Maude JAYSON Emmer, MD;  Location: [facility] ENDOSCOPY;  Service: Cardiovascular;  Laterality: N/A;   CYSTOSCOPY WITH FULGERATION N/A 03/18/2018   Procedure: CYSTOSCOPY WITH FULGERATION/ BLADDER BIOPSY;  Surgeon: Nieves Cough, MD;  [facility];  Service: Urology;  Laterality: N/A;   CYSTOSCOPY WITH FULGERATION N/A 09/15/2019   Procedure: CYSTOSCOPY WITH FULGERATION/BLADDER BIOPSY/ BILATERAL RETROGRADE;  Surgeon: Nieves Cough, MD;  Location: [facility];  Service: Urology;  Laterality: N/A;   CYSTOSCOPY WITH INSERTION OF UROLIFT N/A 03/18/2018   Procedure: CYSTOSCOPY WITH INSERTION OF UROLIFT;  Surgeon: Nieves Cough, MD;  Location: [facility];  Service: Urology;  Laterality: N/A;   IR US  LIVER BIOPSY  09/09/2024   KNEE ARTHROSCOPY Left 2009  approx.   TONSILLECTOMY  child   TRANSTHORACIC ECHOCARDIOGRAM  09-27-2015  dr wilbert turner   mild focal basal hypertrophy of the septum,  ef 60-65%/  trivial AR/  mild MR/  severe LAE/     Patient has no known allergies. Current Outpatient Medications  Medication Sig Dispense Refill   acetaminophen  (TYLENOL ) 500 MG tablet Take 500 mg by mouth every 6 (six) hours as needed for moderate pain or headache. (Patient taking differently: Take 500 mg by mouth as needed for moderate pain (pain score 4-6) or headache.)     acyclovir  (ZOVIRAX ) 400 MG tablet Take 1 tablet (400 mg total) by mouth 2 (two) times daily. 60 tablet 6   allopurinol  (ZYLOPRIM ) 100 MG tablet TAKE 1 TABLET BY MOUTH TWICE A DAY 180 tablet 1   apixaban  (ELIQUIS ) 5 MG TABS tablet Take 1 tablet (5 mg total) by mouth 2 (two) times daily. 180 tablet 1   dexamethasone  (DECADRON ) 4 MG tablet Take 2 tablets (8 mg total) by mouth daily. Start the day after  bendamustine  chemotherapy for 2 days. Take with food. 30 tablet 1   finasteride (PROSCAR) 5 MG tablet Take 5 mg by mouth daily.     Magnesium 250 MG TABS Take 250 mg by mouth daily at 6 (six) AM. (Patient taking differently: Take 250 mg by mouth as needed.)     metoprolol  succinate (TOPROL -XL) 50 MG 24 hr tablet TAKE 1.5 TABLETS EVERY MORNING AND 0.5 TAB EVERY EVENING TAKE WITH OR IMMEDIATELY FOLLOWING A MEAL. 180 tablet 0   Multiple Vitamins-Minerals (MULTIVITAMIN WITH MINERALS) tablet Take 1 tablet by mouth 2 (two) times a week.      ondansetron  (ZOFRAN ) 8 MG tablet Take 1 tablet (8 mg total) by mouth every 8 (eight) hours as needed for nausea or vomiting. Start on the third day after chemotherapy. 30 tablet 1   prochlorperazine  (COMPAZINE ) 10 MG tablet Take 1 tablet (10 mg total) by mouth every 6 (six) hours as needed for nausea or vomiting. 30 tablet 1   sildenafil (REVATIO) 20 MG tablet Take 20-100 mg by mouth daily as needed for erectile dysfunction. (Patient taking differently: Take 20-100 mg by mouth as needed for erectile dysfunction.)     tamsulosin (FLOMAX) 0.4 MG CAPS capsule Take 0.4 mg by mouth daily.  No current facility-administered medications for this encounter.    Physical Exam: BP 112/70   Pulse 82   Ht 5' 9 (1.753 m)   Wt 67.4 kg   BMI 21.94 kg/m   GEN: Well nourished, well developed in no acute distress NECK: No JVD; No carotid bruits CARDIAC: Irregularly irregular rate and rhythm, no murmurs, rubs, gallops RESPIRATORY:  Clear to auscultation without rales, wheezing or rhonchi  ABDOMEN: Soft, non-tender, non-distended EXTREMITIES:  No edema; No deformity   Wt Readings from Last 3 Encounters:  12/09/24 67.4 kg  11/17/24 66.2 kg  11/05/24 66.7 kg    Lab Results  Component Value Date   TSH 1.542 09/15/2015   EKG today demonstrates:   EKG Interpretation Date/Time:  Wednesday December 09 2024 09:07:29 EST Ventricular Rate:  82 PR Interval:    QRS  Duration:  70 QT Interval:  354 QTC Calculation: 413 R Axis:   142  Text Interpretation: Atrial fibrillation Right axis deviation Septal infarct , age undetermined Abnormal ECG When compared with ECG of 06-Jan-2024 09:01, No significant change was found Confirmed by Wyn Manus [phone]) on 12/09/2024 9:10:51 AM       Echo Completed 09/27/2015:  Left ventricle: The cavity size was normal. There was mild focal    basal hypertrophy of the septum. Systolic function was normal.    The estimated ejection fraction was in the range of 60% to 65%.    Wall motion was normal; there were no regional wall motion    abnormalities.  - Aortic valve: There was trivial regurgitation.  - Mitral valve: There was mild regurgitation.  - Left atrium: The atrium was severely dilated.  - Right ventricle: The cavity size was normal. Wall thickness was    normal. Systolic function was normal.  - Inferior vena cava: The vessel was normal in size. The    respirophasic diameter changes were in the normal range (>= 50%),    consistent with normal central venous pressure.   CHA2DS2-VASc Score = 3  The patient's score is based upon: CHF History: 0 HTN History: 1 Diabetes History: 0 Stroke History: 0 Vascular Disease History: 0 Age Score: 2 Gender Score: 0      ASSESSMENT AND PLAN: Longstanding Persistent Atrial Fibrillation (ICD10:  I48.11) The patient's CHA2DS2-VASc score is 3, indicating a 3.2% annual risk of stroke.   Dx in  2016 and started on rate control with metoprolol  and OAC with Eliquis  with election to not pursue rhythm control due to being asymptomatic. - Contacted our office recently with complaints of palpitations during second oncology effusion for recently diagnosed lymphoma -Today patient is in rate controlled atrial fibrillation and reports no further breakthrough episodes following his chemo treatments.  Stable blood pressure. Effective metoprolol  regimen. - Increase evening metoprolol  to  50 mg before chemotherapy. - Continue Eliquis  5 mg twice daily - Contact clinic for unresolved symptomatic episodes. - Consider cardioversion or medication adjustment if symptoms persist.  Secondary Hypercoagulable State (ICD10:  D68.69) The patient is at significant risk for stroke/thromboembolism based upon his CHA2DS2-VASc Score of 3.  Continue Apixaban  (Eliquis ).   Stage IV mantle cell lymphoma: - Currently under chemo therapy treatments and followed by oncology.  Signed,  Wyn Raddle, Manus Shove, NP    12/09/2024 9:35 AM    Follow up with the AF Clinic as needed   Discussed the use of AI scribe software for clinical note transcription with the patient, who gave verbal consent to proceed.  "

## 2024-12-09 ENCOUNTER — Ambulatory Visit (HOSPITAL_COMMUNITY)
Admission: RE | Admit: 2024-12-09 | Discharge: 2024-12-09 | Disposition: A | Discharge status: Home / Self Care | Source: Ambulatory Visit | Attending: Nurse Practitioner | Admitting: Nurse Practitioner

## 2024-12-09 ENCOUNTER — Encounter (HOSPITAL_COMMUNITY): Payer: Self-pay | Admitting: Nurse Practitioner

## 2024-12-09 VITALS — BP 112/70 | HR 82 | Ht 69.0 in | Wt 148.6 lb

## 2024-12-09 DIAGNOSIS — D6859 Other primary thrombophilia: Secondary | ICD-10-CM

## 2024-12-09 DIAGNOSIS — I4811 Longstanding persistent atrial fibrillation: Secondary | ICD-10-CM | POA: Diagnosis not present

## 2024-12-09 DIAGNOSIS — C8310 Mantle cell lymphoma, unspecified site: Secondary | ICD-10-CM

## 2024-12-09 DIAGNOSIS — I4891 Unspecified atrial fibrillation: Secondary | ICD-10-CM

## 2024-12-11 ENCOUNTER — Telehealth: Payer: Self-pay | Admitting: Hematology

## 2024-12-11 NOTE — Telephone Encounter (Signed)
 Received a voicemail from the patient saying that he got a message about needing to reschedule his appts on 1/26 and 1/27. I got him moved to the first week of February. I called and spoke with him, he is aware and will also check MyChart for the rest of the tx days that are being adjusted.

## 2024-12-12 ENCOUNTER — Other Ambulatory Visit: Payer: Self-pay

## 2024-12-14 ENCOUNTER — Inpatient Hospital Stay

## 2024-12-14 ENCOUNTER — Inpatient Hospital Stay: Admitting: Hematology

## 2024-12-15 ENCOUNTER — Inpatient Hospital Stay

## 2024-12-15 ENCOUNTER — Inpatient Hospital Stay: Attending: Hematology and Oncology | Admitting: Dietician

## 2024-12-22 ENCOUNTER — Other Ambulatory Visit: Payer: Self-pay

## 2024-12-22 ENCOUNTER — Other Ambulatory Visit: Payer: Self-pay | Admitting: Hematology

## 2024-12-22 ENCOUNTER — Inpatient Hospital Stay: Attending: Hematology and Oncology

## 2024-12-22 ENCOUNTER — Inpatient Hospital Stay

## 2024-12-22 VITALS — BP 101/70 | HR 85 | Temp 97.8°F | Resp 18 | Wt 146.0 lb

## 2024-12-22 DIAGNOSIS — C8312 Mantle cell lymphoma, intrathoracic lymph nodes: Secondary | ICD-10-CM

## 2024-12-22 LAB — URINALYSIS, COMPLETE (UACMP) WITH MICROSCOPIC
Bilirubin Urine: NEGATIVE
Glucose, UA: NEGATIVE mg/dL
Ketones, ur: NEGATIVE mg/dL
Nitrite: POSITIVE — AB
Protein, ur: 100 mg/dL — AB
Specific Gravity, Urine: 1.015 (ref 1.005–1.030)
WBC, UA: >50 WBC/hpf (ref 0–5)
pH: 5 (ref 5.0–8.0)

## 2024-12-22 LAB — CBC WITH DIFFERENTIAL (CANCER CENTER ONLY)
Abs Immature Granulocytes: 0.03 10*3/uL (ref 0.00–0.07)
Basophils Absolute: 0 10*3/uL (ref 0.0–0.1)
Basophils Relative: 0 %
Eosinophils Absolute: 0.1 10*3/uL (ref 0.0–0.5)
Eosinophils Relative: 1 %
HCT: 37.5 % — ABNORMAL LOW (ref 39.0–52.0)
Hemoglobin: 12.5 g/dL — ABNORMAL LOW (ref 13.0–17.0)
Immature Granulocytes: 1 %
Lymphocytes Relative: 3 %
Lymphs Abs: 0.1 10*3/uL — ABNORMAL LOW (ref 0.7–4.0)
MCH: 30 pg (ref 26.0–34.0)
MCHC: 33.3 g/dL (ref 30.0–36.0)
MCV: 90.1 fL (ref 80.0–100.0)
Monocytes Absolute: 0.8 10*3/uL (ref 0.1–1.0)
Monocytes Relative: 19 %
Neutro Abs: 3.2 10*3/uL (ref 1.7–7.7)
Neutrophils Relative %: 76 %
Platelet Count: 137 10*3/uL — ABNORMAL LOW (ref 150–400)
RBC: 4.16 MIL/uL — ABNORMAL LOW (ref 4.22–5.81)
RDW: 15.5 % (ref 11.5–15.5)
WBC Count: 4.2 10*3/uL (ref 4.0–10.5)
nRBC: 0 % (ref 0.0–0.2)

## 2024-12-22 LAB — CMP (CANCER CENTER ONLY)
ALT: 30 U/L (ref 0–44)
AST: 34 U/L (ref 15–41)
Albumin: 3.7 g/dL (ref 3.5–5.0)
Alkaline Phosphatase: 175 U/L — ABNORMAL HIGH (ref 38–126)
Anion gap: 11 (ref 5–15)
BUN: 14 mg/dL (ref 8–23)
CO2: 25 mmol/L (ref 22–32)
Calcium: 9.1 mg/dL (ref 8.9–10.3)
Chloride: 101 mmol/L (ref 98–111)
Creatinine: 0.87 mg/dL (ref 0.61–1.24)
GFR, Estimated: >60 mL/min
Glucose, Bld: 99 mg/dL (ref 70–99)
Potassium: 3.9 mmol/L (ref 3.5–5.1)
Sodium: 136 mmol/L (ref 135–145)
Total Bilirubin: 0.4 mg/dL (ref 0.0–1.2)
Total Protein: 6.9 g/dL (ref 6.5–8.1)

## 2024-12-22 MED ORDER — SODIUM CHLORIDE 0.9 % IV SOLN: INTRAVENOUS | Status: DC

## 2024-12-22 MED ORDER — DIPHENHYDRAMINE HCL 25 MG PO CAPS
50.0000 mg | ORAL_CAPSULE | Freq: Once | ORAL | Status: AC
  Administered 2024-12-22: 50 mg via ORAL
  Filled 2024-12-22: qty 2

## 2024-12-22 MED ORDER — SODIUM CHLORIDE 0.9 % IV SOLN
90.0000 mg/m2 | Freq: Once | INTRAVENOUS | Status: AC
  Administered 2024-12-22: 170 mg via INTRAVENOUS
  Filled 2024-12-22: qty 6.8

## 2024-12-22 MED ORDER — DEXAMETHASONE SOD PHOSPHATE PF 10 MG/ML IJ SOLN
10.0000 mg | Freq: Once | INTRAMUSCULAR | Status: AC
  Administered 2024-12-22: 10 mg via INTRAVENOUS
  Filled 2024-12-22: qty 1

## 2024-12-22 MED ORDER — CEFPODOXIME PROXETIL 200 MG PO TABS: 200.0000 mg | ORAL_TABLET | Freq: Two times a day (BID) | ORAL | 0 refills | Status: AC

## 2024-12-22 MED ORDER — PALONOSETRON HCL INJECTION 0.25 MG/5ML
0.2500 mg | Freq: Once | INTRAVENOUS | Status: AC
  Administered 2024-12-22: 0.25 mg via INTRAVENOUS
  Filled 2024-12-22: qty 5

## 2024-12-22 MED ORDER — ACETAMINOPHEN 325 MG PO TABS
650.0000 mg | ORAL_TABLET | Freq: Once | ORAL | Status: AC
  Administered 2024-12-22: 650 mg via ORAL
  Filled 2024-12-22: qty 2

## 2024-12-22 MED ORDER — SODIUM CHLORIDE 0.9 % IV SOLN
375.0000 mg/m2 | Freq: Once | INTRAVENOUS | Status: AC
  Administered 2024-12-22: 700 mg via INTRAVENOUS
  Filled 2024-12-22: qty 20

## 2024-12-22 NOTE — Patient Instructions (Signed)
 Rituximab Injection What is this medication? RITUXIMAB (ri TUX i mab) treats leukemia and lymphoma. It works by blocking a protein that causes cancer cells to grow and multiply. This helps to slow or stop the spread of cancer cells. It may also be used to treat autoimmune conditions, such as arthritis. It works by slowing down an overactive immune system. It is a monoclonal antibody. This medicine may be used for other purposes; ask your health care provider or pharmacist if you have questions. COMMON BRAND NAME(S): RIABNI, Rituxan, RUXIENCE, truxima What should I tell my care team before I take this medication? They need to know if you have any of these conditions: Chest pain Heart disease Immune system problems Infection, such as chickenpox, cold sores, hepatitis B, herpes Irregular heartbeat or rhythm Kidney disease Low blood counts, such as low white cells, platelets, red cells Lung disease Recent or upcoming vaccine An unusual or allergic reaction to rituximab, other medications, foods, dyes, or preservatives Pregnant or trying to get pregnant Breast-feeding How should I use this medication? This medication is injected into a vein. It is given by a care team in a hospital or clinic setting. A special MedGuide will be given to you before each treatment. Be sure to read this information carefully each time. Talk to your care team about the use of this medication in children. While this medication may be prescribed for children as young as 6 months for selected conditions, precautions do apply. Overdosage: If you think you have taken too much of this medicine contact a poison control center or emergency room at once. NOTE: This medicine is only for you. Do not share this medicine with others. What if I miss a dose? Keep appointments for follow-up doses. It is important not to miss your dose. Call your care team if you are unable to keep an appointment. What may interact with this  medication? Do not take this medication with any of the following: Live vaccines This medication may also interact with the following: Cisplatin This list may not describe all possible interactions. Give your health care provider a list of all the medicines, herbs, non-prescription drugs, or dietary supplements you use. Also tell them if you smoke, drink alcohol, or use illegal drugs. Some items may interact with your medicine. What should I watch for while using this medication? Your condition will be monitored carefully while you are receiving this medication. You may need blood work while taking this medication. This medication can cause serious infusion reactions. To reduce the risk your care team may give you other medications to take before receiving this one. Be sure to follow the directions from your care team. This medication may increase your risk of getting an infection. Call your care team for advice if you get a fever, chills, sore throat, or other symptoms of a cold or flu. Do not treat yourself. Try to avoid being around people who are sick. Call your care team if you are around anyone with measles, chickenpox, or if you develop sores or blisters that do not heal properly. Avoid taking medications that contain aspirin, acetaminophen, ibuprofen, naproxen, or ketoprofen unless instructed by your care team. These medications may hide a fever. This medication may cause serious skin reactions. They can happen weeks to months after starting the medication. Contact your care team right away if you notice fevers or flu-like symptoms with a rash. The rash may be red or purple and then turn into blisters or peeling of the skin.  You may also notice a red rash with swelling of the face, lips, or lymph nodes in your neck or under your arms. In some patients, this medication may cause a serious brain infection that may cause death. If you have any problems seeing, thinking, speaking, walking, or  standing, tell your care team right away. If you cannot reach your care team, urgently seek another source of medical care. Talk to your care team if you may be pregnant. Serious birth defects can occur if you take this medication during pregnancy and for 12 months after the last dose. You will need a negative pregnancy test before starting this medication. Contraception is recommended while taking this medication and for 12 months after the last dose. Your care team can help you find the option that works for you. Do not breastfeed while taking this medication and for at least 6 months after the last dose. What side effects may I notice from receiving this medication? Side effects that you should report to your care team as soon as possible: Allergic reactions or angioedema--skin rash, itching or hives, swelling of the face, eyes, lips, tongue, arms, or legs, trouble swallowing or breathing Bowel blockage--stomach cramping, unable to have a bowel movement or pass gas, loss of appetite, vomiting Dizziness, loss of balance or coordination, confusion or trouble speaking Heart attack--pain or tightness in the chest, shoulders, arms, or jaw, nausea, shortness of breath, cold or clammy skin, feeling faint or lightheaded Heart rhythm changes--fast or irregular heartbeat, dizziness, feeling faint or lightheaded, chest pain, trouble breathing Infection--fever, chills, cough, sore throat, wounds that don't heal, pain or trouble when passing urine, general feeling of discomfort or being unwell Infusion reactions--chest pain, shortness of breath or trouble breathing, feeling faint or lightheaded Kidney injury--decrease in the amount of urine, swelling of the ankles, hands, or feet Liver injury--right upper belly pain, loss of appetite, nausea, light-colored stool, dark yellow or brown urine, yellowing skin or eyes, unusual weakness or fatigue Redness, blistering, peeling, or loosening of the skin, including  inside the mouth Stomach pain that is severe, does not go away, or gets worse Tumor lysis syndrome (TLS)--nausea, vomiting, diarrhea, decrease in the amount of urine, dark urine, unusual weakness or fatigue, confusion, muscle pain or cramps, fast or irregular heartbeat, joint pain Side effects that usually do not require medical attention (report to your care team if they continue or are bothersome): Headache Joint pain Nausea Runny or stuffy nose Unusual weakness or fatigue This list may not describe all possible side effects. Call your doctor for medical advice about side effects. You may report side effects to FDA at 1-800-FDA-1088. Where should I keep my medication? This medication is given in a hospital or clinic. It will not be stored at home. NOTE: This sheet is a summary. It may not cover all possible information. If you have questions about this medicine, talk to your doctor, pharmacist, or health care provider.  2024 Elsevier/Gold Standard (2022-03-29 00:00:00)

## 2024-12-23 ENCOUNTER — Inpatient Hospital Stay: Admitting: Dietician

## 2024-12-23 ENCOUNTER — Inpatient Hospital Stay

## 2024-12-23 VITALS — BP 112/76 | HR 74 | Temp 97.4°F | Resp 18

## 2024-12-23 DIAGNOSIS — C8312 Mantle cell lymphoma, intrathoracic lymph nodes: Secondary | ICD-10-CM

## 2024-12-23 MED ORDER — SODIUM CHLORIDE 0.9 % IV SOLN: INTRAVENOUS | Status: DC

## 2024-12-23 MED ORDER — DEXAMETHASONE SOD PHOSPHATE PF 10 MG/ML IJ SOLN
10.0000 mg | Freq: Once | INTRAMUSCULAR | Status: AC
  Administered 2024-12-23: 10 mg via INTRAVENOUS
  Filled 2024-12-23: qty 1

## 2024-12-23 MED ORDER — SODIUM CHLORIDE 0.9 % IV SOLN
90.0000 mg/m2 | Freq: Once | INTRAVENOUS | Status: AC
  Administered 2024-12-23: 170 mg via INTRAVENOUS
  Filled 2024-12-23: qty 6.8

## 2024-12-23 NOTE — Patient Instructions (Signed)
 [facility] - A DEPT OF [facility]  Discharge Instructions: Thank you for choosing [facility] to provide your oncology and hematology care.   If you have a lab appointment with the Cancer Center, please go directly to the Cancer Center and check in at the registration area.   Wear comfortable clothing and clothing appropriate for easy access to any Portacath or PICC line.   We strive to give you quality time with your provider. You may need to reschedule your appointment if you arrive late (15 or more minutes).  Arriving late affects you and other patients whose appointments are after yours.  Also, if you miss three or more appointments without notifying the office, you may be dismissed from the clinic at the provider's discretion.      For prescription refill requests, have your pharmacy contact our office and allow 72 hours for refills to be completed.    Today you received the following chemotherapy and/or immunotherapy agents bendeka      To help prevent nausea and vomiting after your treatment, we encourage you to take your nausea medication as directed.  BELOW ARE SYMPTOMS THAT SHOULD BE REPORTED IMMEDIATELY: *FEVER GREATER THAN 100.4 F (38 C) OR HIGHER *CHILLS OR SWEATING *NAUSEA AND VOMITING THAT IS NOT CONTROLLED WITH YOUR NAUSEA MEDICATION *UNUSUAL SHORTNESS OF BREATH *UNUSUAL BRUISING OR BLEEDING *URINARY PROBLEMS (pain or burning when urinating, or frequent urination) *BOWEL PROBLEMS (unusual diarrhea, constipation, pain near the anus) TENDERNESS IN MOUTH AND THROAT WITH OR WITHOUT PRESENCE OF ULCERS (sore throat, sores in mouth, or a toothache) UNUSUAL RASH, SWELLING OR PAIN  UNUSUAL VAGINAL DISCHARGE OR ITCHING   Items with * indicate a potential emergency and should be followed up as soon as possible or go to the Emergency Department if any problems should occur.  Please show the CHEMOTHERAPY ALERT CARD or IMMUNOTHERAPY  ALERT CARD at check-in to the Emergency Department and triage nurse.  Should you have questions after your visit or need to cancel or reschedule your appointment, please contact [facility] - A DEPT OF [facility]  Dept: [phone]  and follow the prompts.  Office hours are 8:00 a.m. to 4:30 p.m. Monday - Friday. Please note that voicemails left after 4:00 p.m. may not be returned until the following business day.  We are closed weekends and major holidays. You have access to a nurse at all times for urgent questions. Please call the main number to the clinic Dept: [phone] and follow the prompts.   For any non-urgent questions, you may also contact your provider using MyChart. We now offer e-Visits for anyone 8 and older to request care online for non-urgent symptoms. For details visit [email or web address].   Also download the MyChart app! Go to the app store, search "MyChart", open the app, select [place], and log in with your MyChart username and password.

## 2024-12-23 NOTE — Progress Notes (Signed)
 Nutrition Follow-up:  Pt with Mantle cell NHL. He is receiving rituximab  + bendamustine  q28d (start 12/3). Patient is under the care of Dr. Onesimo.   Met with patient in infusion. He is doing well today. Patient felt great after infusion yesterday. Reports increased energy which was followed by good nights sleep. Appetite is good. He tried CIB and liked this. Patient has not used supplement in the last couple weeks as he has been eating more. Patient denies NIS at this time.    Medications: reviewed   Labs: reviewed   Anthropometrics: Wt 146 lb today - stable x5 weeks  12/30 - 146 lb    NUTRITION DIAGNOSIS: Unintended wt loss - stable   INTERVENTION:  Continue strategies for increasing calories and protein for wt maintenance/gain Patient liked CIB - encourage drinking daily     MONITORING, EVALUATION, GOAL: wt trends, intake   NEXT VISIT: To be scheduled as needed

## 2024-12-24 LAB — URINE CULTURE: Culture: >=100000 — AB

## 2024-12-29 ENCOUNTER — Inpatient Hospital Stay

## 2024-12-29 ENCOUNTER — Inpatient Hospital Stay: Admitting: Hematology

## 2025-01-12 ENCOUNTER — Inpatient Hospital Stay

## 2025-01-13 ENCOUNTER — Inpatient Hospital Stay

## 2025-01-13 ENCOUNTER — Inpatient Hospital Stay: Admitting: Hematology

## 2025-01-19 ENCOUNTER — Inpatient Hospital Stay: Attending: Hematology and Oncology

## 2025-01-19 ENCOUNTER — Inpatient Hospital Stay: Admitting: Hematology

## 2025-01-19 ENCOUNTER — Inpatient Hospital Stay

## 2025-01-20 ENCOUNTER — Inpatient Hospital Stay
